# Patient Record
Sex: Female | Born: 1945 | Race: White | Hispanic: No | Marital: Married | Smoking: Former smoker
Health system: Southern US, Community
[De-identification: ages and names within clinical notes are randomized; demographics above are authoritative.]

## PROBLEM LIST (undated history)

## (undated) DIAGNOSIS — N83209 Unspecified ovarian cyst, unspecified side: Secondary | ICD-10-CM

## (undated) DIAGNOSIS — D369 Benign neoplasm, unspecified site: Secondary | ICD-10-CM

## (undated) DIAGNOSIS — N952 Postmenopausal atrophic vaginitis: Secondary | ICD-10-CM

## (undated) DIAGNOSIS — N879 Dysplasia of cervix uteri, unspecified: Secondary | ICD-10-CM

## (undated) HISTORY — PX: APPENDECTOMY: SHX54

## (undated) HISTORY — PX: RHINOPLASTY: SUR1284

## (undated) HISTORY — DX: Postmenopausal atrophic vaginitis: N95.2

## (undated) HISTORY — DX: Benign neoplasm, unspecified site: D36.9

## (undated) HISTORY — DX: Unspecified ovarian cyst, unspecified side: N83.209

## (undated) HISTORY — PX: OTHER SURGICAL HISTORY: SHX169

## (undated) HISTORY — DX: Dysplasia of cervix uteri, unspecified: N87.9

## (undated) HISTORY — PX: TUBAL LIGATION: SHX77

## (undated) HISTORY — PX: COLPOSCOPY: SHX161

## (undated) HISTORY — PX: ANKLE SURGERY: SHX546

---

## 1997-07-16 ENCOUNTER — Other Ambulatory Visit: Admission: RE | Admit: 1997-07-16 | Discharge: 1997-07-16 | Payer: Self-pay | Admitting: Obstetrics and Gynecology

## 1998-12-02 ENCOUNTER — Other Ambulatory Visit: Admission: RE | Admit: 1998-12-02 | Discharge: 1998-12-02 | Payer: Self-pay | Admitting: Obstetrics and Gynecology

## 1999-02-25 ENCOUNTER — Encounter: Admission: RE | Admit: 1999-02-25 | Discharge: 1999-02-25 | Payer: Self-pay | Admitting: Obstetrics and Gynecology

## 1999-02-25 ENCOUNTER — Encounter: Payer: Self-pay | Admitting: Obstetrics and Gynecology

## 2000-01-21 ENCOUNTER — Other Ambulatory Visit: Admission: RE | Admit: 2000-01-21 | Discharge: 2000-01-21 | Payer: Self-pay | Admitting: Obstetrics and Gynecology

## 2000-01-24 ENCOUNTER — Other Ambulatory Visit: Admission: RE | Admit: 2000-01-24 | Discharge: 2000-01-24 | Payer: Self-pay | Admitting: Obstetrics and Gynecology

## 2000-04-24 ENCOUNTER — Encounter: Admission: RE | Admit: 2000-04-24 | Discharge: 2000-04-24 | Payer: Self-pay | Admitting: Obstetrics and Gynecology

## 2000-04-24 ENCOUNTER — Encounter: Payer: Self-pay | Admitting: Obstetrics and Gynecology

## 2000-06-02 ENCOUNTER — Ambulatory Visit (HOSPITAL_COMMUNITY): Admission: RE | Admit: 2000-06-02 | Discharge: 2000-06-02 | Payer: Self-pay | Admitting: Gastroenterology

## 2001-03-12 ENCOUNTER — Other Ambulatory Visit: Admission: RE | Admit: 2001-03-12 | Discharge: 2001-03-12 | Payer: Self-pay | Admitting: Obstetrics and Gynecology

## 2001-06-18 ENCOUNTER — Encounter: Payer: Self-pay | Admitting: Obstetrics and Gynecology

## 2001-06-18 ENCOUNTER — Encounter: Admission: RE | Admit: 2001-06-18 | Discharge: 2001-06-18 | Payer: Self-pay | Admitting: Obstetrics and Gynecology

## 2002-04-29 ENCOUNTER — Other Ambulatory Visit: Admission: RE | Admit: 2002-04-29 | Discharge: 2002-04-29 | Payer: Self-pay | Admitting: Obstetrics and Gynecology

## 2002-07-04 ENCOUNTER — Encounter: Payer: Self-pay | Admitting: Obstetrics and Gynecology

## 2002-07-04 ENCOUNTER — Encounter: Admission: RE | Admit: 2002-07-04 | Discharge: 2002-07-04 | Payer: Self-pay | Admitting: Obstetrics and Gynecology

## 2003-07-02 ENCOUNTER — Other Ambulatory Visit: Admission: RE | Admit: 2003-07-02 | Discharge: 2003-07-02 | Payer: Self-pay | Admitting: Obstetrics and Gynecology

## 2003-08-06 ENCOUNTER — Encounter: Admission: RE | Admit: 2003-08-06 | Discharge: 2003-08-06 | Payer: Self-pay | Admitting: Obstetrics and Gynecology

## 2003-12-24 ENCOUNTER — Ambulatory Visit: Payer: Self-pay | Admitting: Pulmonary Disease

## 2004-01-30 ENCOUNTER — Ambulatory Visit: Payer: Self-pay | Admitting: Pulmonary Disease

## 2004-01-30 LAB — PULMONARY FUNCTION TEST

## 2004-08-11 ENCOUNTER — Other Ambulatory Visit: Admission: RE | Admit: 2004-08-11 | Discharge: 2004-08-11 | Payer: Self-pay | Admitting: Obstetrics and Gynecology

## 2004-09-10 ENCOUNTER — Encounter: Admission: RE | Admit: 2004-09-10 | Discharge: 2004-09-10 | Payer: Self-pay | Admitting: Obstetrics and Gynecology

## 2004-12-31 ENCOUNTER — Ambulatory Visit: Payer: Self-pay | Admitting: Internal Medicine

## 2005-01-20 ENCOUNTER — Encounter: Admission: RE | Admit: 2005-01-20 | Discharge: 2005-01-20 | Payer: Self-pay | Admitting: Family Medicine

## 2005-02-07 ENCOUNTER — Encounter: Admission: RE | Admit: 2005-02-07 | Discharge: 2005-02-07 | Payer: Self-pay | Admitting: Family Medicine

## 2005-02-21 ENCOUNTER — Ambulatory Visit: Payer: Self-pay | Admitting: Pulmonary Disease

## 2005-02-24 ENCOUNTER — Encounter: Admission: RE | Admit: 2005-02-24 | Discharge: 2005-02-24 | Payer: Self-pay | Admitting: Family Medicine

## 2005-03-21 ENCOUNTER — Encounter: Admission: RE | Admit: 2005-03-21 | Discharge: 2005-03-21 | Payer: Self-pay | Admitting: Family Medicine

## 2005-06-01 ENCOUNTER — Ambulatory Visit: Payer: Self-pay | Admitting: Pulmonary Disease

## 2005-09-05 ENCOUNTER — Other Ambulatory Visit: Admission: RE | Admit: 2005-09-05 | Discharge: 2005-09-05 | Payer: Self-pay | Admitting: Obstetrics and Gynecology

## 2005-10-26 ENCOUNTER — Encounter: Admission: RE | Admit: 2005-10-26 | Discharge: 2005-10-26 | Payer: Self-pay | Admitting: Obstetrics and Gynecology

## 2005-11-28 ENCOUNTER — Ambulatory Visit: Payer: Self-pay | Admitting: Emergency Medicine

## 2005-12-02 ENCOUNTER — Ambulatory Visit: Payer: Self-pay | Admitting: Pulmonary Disease

## 2006-01-17 HISTORY — PX: OTHER SURGICAL HISTORY: SHX169

## 2006-06-15 ENCOUNTER — Ambulatory Visit: Payer: Self-pay | Admitting: Pulmonary Disease

## 2006-06-21 ENCOUNTER — Ambulatory Visit: Payer: Self-pay | Admitting: Pulmonary Disease

## 2006-09-13 ENCOUNTER — Ambulatory Visit: Payer: Self-pay | Admitting: Internal Medicine

## 2006-09-13 ENCOUNTER — Ambulatory Visit: Admission: RE | Admit: 2006-09-13 | Discharge: 2006-09-13 | Payer: Self-pay | Admitting: Internal Medicine

## 2006-09-13 LAB — PULMONARY FUNCTION TEST

## 2006-09-20 ENCOUNTER — Other Ambulatory Visit: Admission: RE | Admit: 2006-09-20 | Discharge: 2006-09-20 | Payer: Self-pay | Admitting: Obstetrics and Gynecology

## 2006-11-23 ENCOUNTER — Encounter: Admission: RE | Admit: 2006-11-23 | Discharge: 2006-11-23 | Payer: Self-pay | Admitting: Obstetrics and Gynecology

## 2007-01-02 ENCOUNTER — Ambulatory Visit: Payer: Self-pay | Admitting: Internal Medicine

## 2007-01-02 DIAGNOSIS — J45909 Unspecified asthma, uncomplicated: Secondary | ICD-10-CM | POA: Insufficient documentation

## 2007-01-02 DIAGNOSIS — J310 Chronic rhinitis: Secondary | ICD-10-CM | POA: Insufficient documentation

## 2007-04-10 ENCOUNTER — Ambulatory Visit: Payer: Self-pay | Admitting: Internal Medicine

## 2007-11-12 ENCOUNTER — Other Ambulatory Visit: Admission: RE | Admit: 2007-11-12 | Discharge: 2007-11-12 | Payer: Self-pay | Admitting: Obstetrics and Gynecology

## 2007-11-12 ENCOUNTER — Encounter: Payer: Self-pay | Admitting: Obstetrics and Gynecology

## 2007-11-12 ENCOUNTER — Ambulatory Visit: Payer: Self-pay | Admitting: Obstetrics and Gynecology

## 2008-01-15 ENCOUNTER — Encounter: Admission: RE | Admit: 2008-01-15 | Discharge: 2008-01-15 | Payer: Self-pay | Admitting: Obstetrics and Gynecology

## 2008-12-15 ENCOUNTER — Ambulatory Visit: Payer: Self-pay | Admitting: Obstetrics and Gynecology

## 2008-12-15 ENCOUNTER — Other Ambulatory Visit: Admission: RE | Admit: 2008-12-15 | Discharge: 2008-12-15 | Payer: Self-pay | Admitting: Obstetrics and Gynecology

## 2009-02-09 ENCOUNTER — Encounter: Admission: RE | Admit: 2009-02-09 | Discharge: 2009-02-09 | Payer: Self-pay | Admitting: Obstetrics and Gynecology

## 2009-12-28 ENCOUNTER — Other Ambulatory Visit
Admission: RE | Admit: 2009-12-28 | Discharge: 2009-12-28 | Payer: Self-pay | Source: Home / Self Care | Admitting: Obstetrics and Gynecology

## 2009-12-28 ENCOUNTER — Ambulatory Visit: Payer: Self-pay | Admitting: Obstetrics and Gynecology

## 2010-02-07 ENCOUNTER — Encounter: Payer: Self-pay | Admitting: Obstetrics and Gynecology

## 2010-02-12 ENCOUNTER — Encounter
Admission: RE | Admit: 2010-02-12 | Discharge: 2010-02-12 | Payer: Self-pay | Source: Home / Self Care | Attending: Obstetrics and Gynecology | Admitting: Obstetrics and Gynecology

## 2010-03-11 ENCOUNTER — Institutional Professional Consult (permissible substitution) (INDEPENDENT_AMBULATORY_CARE_PROVIDER_SITE_OTHER): Admitting: Obstetrics and Gynecology

## 2010-03-11 DIAGNOSIS — M810 Age-related osteoporosis without current pathological fracture: Secondary | ICD-10-CM

## 2010-03-25 ENCOUNTER — Ambulatory Visit (INDEPENDENT_AMBULATORY_CARE_PROVIDER_SITE_OTHER): Admitting: Obstetrics and Gynecology

## 2010-03-25 DIAGNOSIS — B3731 Acute candidiasis of vulva and vagina: Secondary | ICD-10-CM

## 2010-03-25 DIAGNOSIS — N898 Other specified noninflammatory disorders of vagina: Secondary | ICD-10-CM

## 2010-03-25 DIAGNOSIS — B373 Candidiasis of vulva and vagina: Secondary | ICD-10-CM

## 2010-06-01 NOTE — Assessment & Plan Note (Signed)
Elizabeth Jenkins                             PULMONARY OFFICE NOTE   JEZEBEL, POLLET                       MRN:          [id number]  DATE:06/15/2006                            DOB:          December 10, 1945    HISTORY OF PRESENT ILLNESS:  The patient is a 65 year old white female  patient of Dr. Jayme Cloud, who has a known history of some mild COPD from  a remote history of smoking, who presents today complaining of a 74-month  history of intermittent chest pain tightness, shortness of breath with  minimal activity, and wheezing that has worsened over the last 2 days.  The patient denies any cough, purulent sputum, fever, chest pain,  exertional chest pain, weight loss, reflux symptoms.  The patient has  not been taking anything over-the-counter for treatment.  Has been using  her albuterol more than usual.   PAST MEDICAL HISTORY:  Reviewed.   CURRENT MEDICATIONS:  Reviewed.   PHYSICAL EXAMINATION:  GENERAL:  The patient is a pleasant female in no  acute distress.  VITAL SIGNS:  She is afebrile with stable vital signs.  Her O2  saturation is 98% on room air.  HEENT:  Nasal mucosa is erythematous.  Nontender sinuses to palpation.  TMs are slightly full without any redness.  NECK:  supple without cervical adenopathy.  there is no JVD.  LUNGS:  Lung sounds reveal coarse breath sounds bilaterally with a few  expiratory wheezes.  CARDIAC:  Regular rate.  ABDOMEN:  Soft and nontender.  EXTREMITIES:  Warm without any calf cyanosis, clubbing or edema.   IMPRESSION AND PLAN:  Mild chronic obstructive pulmonary disease flare.  Chest x-ray is pending at the time of dictation.  The patient will begin  Mucinex DM twice daily.  Use saline nasal spray p.r.n.  Begin a  prednisone taper over the next week.  Xopenex nebulizer treatment was  given in the office.  The patient will return back here in 1-2 months  for follow-up.      Rubye Oaks, NP  Electronically  Signed      Gailen Shelter, MD  Electronically Signed   TP/MedQ  DD: 06/15/2006  DT: 06/15/2006  Job #: [id number]

## 2010-06-01 NOTE — Assessment & Plan Note (Signed)
[facility]                             PULMONARY OFFICE NOTE   PAISELY, BRICK                       MRN:          [id number]  DATE:09/13/2006                            DOB:          Dec 25, 1945    HISTORY:  A 65 year old, white female was previously felt to have COPD  to the point where here labs were sent off for alpha-I antitrypsin  deficiency by her last pulmonary doctor.  It turned out this was  negative.  She comes back as requested for PFTs today and to my  surprise, has perfectly normal lung functions.   She says she has [place] the beach all [place], but she  predict that once the ragweed comes in the fall that she will be stuffy  in the nose and having coughing and dyspnea.  However, she has been on  no medications at all over the summer while at the beach.   She does have minimal residual nasal stuffiness, but no lower  respiratory symptoms at all of any significant cough, dyspnea, fevers,  chills, sweats, chest pain or leg welling.  No nocturnal complaints.   PHYSICAL EXAMINATION:  GENERAL:  She is a slightly anxious, but  pleasant, ambulatory, white female in no acute distress.  VITAL SIGNS:  Stable vital signs.  HEENT:  Mild turbinate edema with watery discharge.  No pallor, polyps  or cyanosis.  Oropharynx is clear.  Dentition negative.  NECK:  Supple without cervical adenopathy or tenderness.  Trachea  midline.  LUNGS:  Lung fields perfectly clear bilaterally to auscultation and  percussion.  HEART:  Regular rate and rhythm without murmurs, rubs or gallops.  ABDOMEN:  Benign.  EXTREMITIES:  Without calf tenderness.  No cyanosis or clubbing.   PFTs were reviewed today and are normal.   IMPRESSION:  No evidence of chronic obstructive pulmonary disease.  This  patient probably has chronic rhinitis with intermittent asthma on the  basis of poorly-controlled rhinitis and/or sinusitis that is seasonal.  One of the  problems is she really does not understand how to use her  medicines and I therefore took the opportunity to educate her on the use  of maintenance versus p.r.n.'s.   RECOMMENDATIONS:  1. Start Nasonex twice daily until all of her nasal symptoms are 100%      resolved.  If she has an acute flareup of nasal symptoms, she needs      to add Afrin to the regimen x5 days and if not satisfied, a sinus      CT scan needs to be done.  2. Albuterol is to be used no more than twice weekly on a p.r.n.      basis.  If she exceeds this, she needs to [place] for an appointment to      re-evaluate whether or not she needs more consistent controlling      medication.  3. I reviewed her PFTs and x-ray from May 2008, as well as her alpha-I      antitrypsin level reassuring her that she does not have significant  chronic lung problems, but is at risk for      acute exacerbations of asthma in the setting of poorly-controlled      rhinitis which needs more attention to detail in terms of managing      both chronically and during excacerbations.     Charlaine Dalton. Sherene Sires, MD, [facility]  Electronically Signed    MBW/MedQ  DD: 09/13/2006  DT: 09/14/2006  Job #: [id number]   cc:   Tasia Catchings, M.D.

## 2010-06-01 NOTE — Assessment & Plan Note (Signed)
[facility]                             PULMONARY OFFICE NOTE   Elizabeth Jenkins, JOHAL                       MRN:          [id number]  DATE:06/21/2006                            DOB:          Apr 30, 1945    This is a very pleasant 65 year old female who follows here for chest  tightness, dyspnea, and left facial pain which she describes as sinus  pain. The patient was last seen by me in November 2007, however she has  seen our nurse practitioner, Rubye Oaks, on the 29th of May for the  same symptoms. At that point, she was placed on a prednisone taper which  have made the symptoms somewhat better, but she still continues to have  difficulties with chest tightness. In the past, she has been tried on  Serevent, Advair, and Foradil with the complaints of muscle cramping  with these medications prompting her to quit them. She is being  maintained on Spiriva which has decreased her air trapping  significantly. She has mostly obstructive disease with very minimal  reactivity, hyperinflation, and a mild decrease in diffusion capacity.  In the past, the patient has been tested for alpha-1 antitrypsin,  however I cannot find the results of this, but to my recollection this  was normal.   The patient, again, had complaints of chest tightness, which she  describes as mostly right sided in location. She denies any  gastroesophageal reflux type of symptoms. She has also had nasal  congestion and runny, watery nose.   The patient denies any fevers, chills, or sweats.   CURRENT MEDICATIONS:  As noted on the intake sheet and include:  1. Premarin.  2. Prometrium.  3. Singulair.  4. Spiriva.  5. She recently discontinued Asmanex.  6. Nasonex as needed, but she has not used it at all and is actually      out of this medication.   PHYSICAL EXAMINATION:  VITAL SIGNS:  As noted, oxygen saturation 99% on  room air.  GENERAL:  This is a thin, well developed female  who is in no acute  distress.  HEENT:  Reveals nasal turbinate edema bilaterally with almost complete  occlusion of the nares.  NECK:  Supple, no adenopathy noted, no JVD.  LUNGS:  Clear, but distant to auscultation bilaterally.  CARDIAC:  Regular rate and rhythm, no murmurs, rubs, or gallops heard.  EXTREMITIES:  The patient has no cyanosis, clubbing, or edema noted.   We did perform spirometry today which compared to prior shows that the  patient has decreased FEV1 to 2.07 liters or 84% predicted with an  FEV1/FVC ratio at 63% predicted, consistent with mild obstruction. We  also reviewed her chest x-ray from the 29th of May and this showed that  the patient had indeed hyperinflation with no acute infiltrate noted. At  this point, I do not deem necessary for the patient to repeat the x-ray.   IMPRESSION:  1. Chronic obstructive pulmonary disease with exacerbation likely      triggered by sinusitis.  2. Acute and chronic sinusitis.  3. Intolerance to multiple  long acting beta agonists.  4. Allergic rhinitis.   PLAN:  1. Place the patient on Rhinocort Aqua 2 inhalations daily to each      nostril.  2. The patient is to continue Spiriva.  3. The patient was instructed to use Mucinex, plain, 2 tablets, twice      daily.  4. For her dyspnea and chest tightness, we will continue using Spiriva      and then use Xopenex HFA 2 inhalations every 4-6 hours p.r.n.  5. The patient will follow up in 6-8 weeks with Dr. Sandrea Hughs, her      choice. She has seen Dr. Sherene Sires before. Follow up at that time will      be needed on alpha-1 antitrypsin study that was ordered today to      double check to make sure that the patient is indeed not deficient      in this regard and also consideration to whether the patient should      be tried on Symbicort at that point. In the past, she has had      difficulties with muscle cramping with long acting beta agonists,      but it may merit giving the  patient a trial on this.  6. The patient is to contact us prior to follow up should any new      problems arise.     Gailen Shelter, MD  Electronically Signed    CLG/MedQ  DD: 06/21/2006  DT: 06/22/2006  Job #: [id number]

## 2010-06-04 NOTE — Assessment & Plan Note (Signed)
[name]                               PULMONARY OFFICE NOTE   Elizabeth Jenkins, Elizabeth Jenkins                       MRN:          [id number]  DATE:11/28/2005                            DOB:          [birth year]    HISTORY OF PRESENT ILLNESS:  The patient is a 65 year old white female  patient of Dr. Jayme Cloud, who presents for an acute office visit.  The  patient has a history of COPD, currently maintained on Spiriva daily.  The  patient complains over the last 5 days that she has had nasal congestion,  sinus pain and pressure, and increased shortness of breath with activity.  The patient has had to use her rescue inhaler on the average 2 times a day  over the last 4 days.  The patient has not been seen in greater than 5  months, reporting that she has been doing very well.  Last visit, she was  started on Asmanex.  However, reports that she did not take and rarely uses  her albuterol up until this past week.   PAST MEDICAL HISTORY:  Reviewed.   CURRENT MEDICATIONS:  Reviewed.   PHYSICAL EXAM:  The patient is a pleasant female, in no acute distress.  She is afebrile with stable vital signs.  HEENT:  Nasal mucosa is erythematous and swollen.  Maxillary sinus  tenderness to percussion.  TMs normal.  NECK:  Supple without adenopathy.  LUNGS:  Fields are clear without any wheezing or crackles.  CARDIAC:  Regular rate and rhythm.  ABDOMEN:  Soft.  UPPER EXTREMITIES:  Warm without any edema.   IMPRESSION AND PLAN:  Acute sinusitis with a mild asthmatic flare.  The  patient is given Augmentin x10 days.  Mucinex DM twice a day.  Nasal hygiene  regimen with saline and Afrin nasal spray x5 days.  The patient was given a  Xopenex nebulizer treatment in the office.  The patient will return here in  4 to 6 weeks with Dr. Jayme Cloud or sooner if needed.      Rubye Oaks, NP  Electronically Signed      Elizabeth Shelter, MD  Electronically Signed   TP/MedQ  DD: 11/28/2005  DT: 11/28/2005  Job #: [id number]

## 2011-01-21 ENCOUNTER — Telehealth: Payer: Self-pay | Admitting: *Deleted

## 2011-01-21 ENCOUNTER — Other Ambulatory Visit: Payer: Self-pay | Admitting: *Deleted

## 2011-01-21 ENCOUNTER — Other Ambulatory Visit: Payer: Self-pay | Admitting: Obstetrics and Gynecology

## 2011-01-21 ENCOUNTER — Ambulatory Visit: Payer: Medicare Other | Admitting: Obstetrics and Gynecology

## 2011-01-21 DIAGNOSIS — N390 Urinary tract infection, site not specified: Secondary | ICD-10-CM | POA: Diagnosis not present

## 2011-01-21 DIAGNOSIS — Z1231 Encounter for screening mammogram for malignant neoplasm of breast: Secondary | ICD-10-CM

## 2011-01-21 LAB — URINALYSIS
Glucose, UA: NEGATIVE mg/dL
Ketones, ur: NEGATIVE mg/dL
Nitrite: NEGATIVE
Protein, ur: NEGATIVE mg/dL
Specific Gravity, Urine: 1.025 (ref 1.005–1.030)
pH: 5 (ref 5.0–8.0)

## 2011-01-21 MED ORDER — NITROFURANTOIN MONOHYD MACRO 100 MG PO CAPS: 100.0000 mg | ORAL_CAPSULE | Freq: Two times a day (BID) | ORAL | Status: AC

## 2011-01-21 NOTE — Telephone Encounter (Signed)
Pt informed with the below note, order in computer. 

## 2011-01-21 NOTE — Telephone Encounter (Signed)
Addended by: Aura Camps on: 01/21/2011 10:08 AM   Modules accepted: Orders

## 2011-01-21 NOTE — Telephone Encounter (Signed)
Pt calling c/o burning with urination off on on all this week. Pt has annual scheduled for feb.18 she is going out of town on Monday. She would like to drop off u/a if possible? No other complains, no fever, nor foul smell or cloudy urine. Please advise

## 2011-01-21 NOTE — Telephone Encounter (Signed)
Have patient drop off urinalysis and we will call her today with results and treatment.

## 2011-01-24 LAB — URINE CULTURE

## 2011-01-25 NOTE — Progress Notes (Signed)
Urine culture showed uti senitive to [employer]

## 2011-02-11 DIAGNOSIS — D369 Benign neoplasm, unspecified site: Secondary | ICD-10-CM | POA: Insufficient documentation

## 2011-02-11 DIAGNOSIS — N879 Dysplasia of cervix uteri, unspecified: Secondary | ICD-10-CM | POA: Insufficient documentation

## 2011-02-11 DIAGNOSIS — N952 Postmenopausal atrophic vaginitis: Secondary | ICD-10-CM | POA: Insufficient documentation

## 2011-02-16 ENCOUNTER — Other Ambulatory Visit: Payer: Self-pay | Admitting: Dermatology

## 2011-02-16 DIAGNOSIS — D485 Neoplasm of uncertain behavior of skin: Secondary | ICD-10-CM | POA: Diagnosis not present

## 2011-02-16 DIAGNOSIS — L723 Sebaceous cyst: Secondary | ICD-10-CM | POA: Diagnosis not present

## 2011-02-16 DIAGNOSIS — D2330 Other benign neoplasm of skin of unspecified part of face: Secondary | ICD-10-CM | POA: Diagnosis not present

## 2011-02-16 DIAGNOSIS — L259 Unspecified contact dermatitis, unspecified cause: Secondary | ICD-10-CM | POA: Diagnosis not present

## 2011-02-16 DIAGNOSIS — D239 Other benign neoplasm of skin, unspecified: Secondary | ICD-10-CM | POA: Diagnosis not present

## 2011-02-17 ENCOUNTER — Ambulatory Visit
Admission: RE | Admit: 2011-02-17 | Discharge: 2011-02-17 | Disposition: A | Discharge status: Home / Self Care | Source: Ambulatory Visit | Attending: Obstetrics and Gynecology | Admitting: Obstetrics and Gynecology

## 2011-02-17 DIAGNOSIS — Z1231 Encounter for screening mammogram for malignant neoplasm of breast: Secondary | ICD-10-CM

## 2011-02-18 DIAGNOSIS — Z23 Encounter for immunization: Secondary | ICD-10-CM | POA: Diagnosis not present

## 2011-03-07 ENCOUNTER — Encounter: Admitting: Obstetrics and Gynecology

## 2011-03-24 ENCOUNTER — Encounter: Payer: Self-pay | Admitting: Obstetrics and Gynecology

## 2011-03-24 ENCOUNTER — Ambulatory Visit (INDEPENDENT_AMBULATORY_CARE_PROVIDER_SITE_OTHER): Payer: Medicare Other | Admitting: Obstetrics and Gynecology

## 2011-03-24 ENCOUNTER — Other Ambulatory Visit (HOSPITAL_COMMUNITY)
Admission: RE | Admit: 2011-03-24 | Discharge: 2011-03-24 | Disposition: A | Discharge status: Home / Self Care | Payer: Medicare Other | Source: Ambulatory Visit | Attending: Obstetrics and Gynecology | Admitting: Obstetrics and Gynecology

## 2011-03-24 VITALS — BP 120/74 | Ht 65.0 in | Wt 122.0 lb

## 2011-03-24 DIAGNOSIS — Z780 Asymptomatic menopausal state: Secondary | ICD-10-CM

## 2011-03-24 DIAGNOSIS — N951 Menopausal and female climacteric states: Secondary | ICD-10-CM | POA: Diagnosis not present

## 2011-03-24 DIAGNOSIS — N83209 Unspecified ovarian cyst, unspecified side: Secondary | ICD-10-CM | POA: Insufficient documentation

## 2011-03-24 DIAGNOSIS — M810 Age-related osteoporosis without current pathological fracture: Secondary | ICD-10-CM

## 2011-03-24 DIAGNOSIS — N952 Postmenopausal atrophic vaginitis: Secondary | ICD-10-CM

## 2011-03-24 DIAGNOSIS — Z124 Encounter for screening for malignant neoplasm of cervix: Secondary | ICD-10-CM | POA: Insufficient documentation

## 2011-03-24 NOTE — Progress Notes (Signed)
Patient came back to see me today for further followup. The first and we discussed is her osteoporosis. She had previously taken biphosphonate's for slightly under 5 years. She still takes calcium and vitamin D. She's had no fractures. She is a nonsmoker. She drinks alcohol in moderation. Her last bone density was in January of 2012. Her worst T score was -2.8 at the left femoral neck. Her bone density and remained stable everywhere but in her wrist. She had lost 5.8% bone in her wrist. She does have vaginal dryness but is fine with intercourse without estrogen cream. She was on menopausal hormonal replacement for many years. She would take breaks. She sometimes had to reinitiate it. She now seems to be fine without it. She does her lab her PCP. She is having no vaginal bleeding. She is having no pelvic pain.  ROS: 12 system review done. Pertinent positives above. Other positives are asthma and chronic rhinitis. She recently had ato take prednisone for an upper respiratory infection with a great  Result.  Physical examination: Kennon Portela present. HEENT within normal limits. Neck: Thyroid not large. No masses. Supraclavicular nodes: not enlarged. Breasts: Examined in both sitting midline position. No skin changes and no masses. Abdomen: Soft no guarding rebound or masses or hernia. Pelvic: External: Within normal limits. BUS: Within normal limits. Vaginal:within normal limits. Fair  estrogen effect. No evidence of cystocele rectocele or enterocele. Cervix: clean. Uterus: Normal size and shape. Adnexa: No masses. Rectovaginal exam: Confirmatory and negative. Extremities: Within normal limits.  Assessment: #1. Osteoporosis #2. Atrophic vaginitis #3. Menopausal symptoms now resolved  Plan: Discussed pros and cons of reinitiating drug treatment for her osteoporosis. Discussed a typical fractures and ON J. For the moment patient declined therapy. She will continue yearly mammograms. We will do followup   bone density in 2014.

## 2011-03-29 DIAGNOSIS — J449 Chronic obstructive pulmonary disease, unspecified: Secondary | ICD-10-CM | POA: Diagnosis not present

## 2011-03-29 DIAGNOSIS — J45909 Unspecified asthma, uncomplicated: Secondary | ICD-10-CM | POA: Diagnosis not present

## 2011-03-29 DIAGNOSIS — J301 Allergic rhinitis due to pollen: Secondary | ICD-10-CM | POA: Diagnosis not present

## 2011-03-29 DIAGNOSIS — J019 Acute sinusitis, unspecified: Secondary | ICD-10-CM | POA: Diagnosis not present

## 2011-07-13 DIAGNOSIS — Z23 Encounter for immunization: Secondary | ICD-10-CM | POA: Diagnosis not present

## 2011-10-11 DIAGNOSIS — L723 Sebaceous cyst: Secondary | ICD-10-CM | POA: Diagnosis not present

## 2011-10-11 DIAGNOSIS — L821 Other seborrheic keratosis: Secondary | ICD-10-CM | POA: Diagnosis not present

## 2011-10-14 DIAGNOSIS — Z23 Encounter for immunization: Secondary | ICD-10-CM | POA: Diagnosis not present

## 2012-01-13 ENCOUNTER — Other Ambulatory Visit: Payer: Self-pay | Admitting: *Deleted

## 2012-01-13 MED ORDER — ZOLPIDEM TARTRATE 10 MG PO TABS: 10.0000 mg | ORAL_TABLET | Freq: Every evening | ORAL | Status: DC | PRN

## 2012-01-13 NOTE — Telephone Encounter (Signed)
rx called in [facility] 

## 2012-01-30 ENCOUNTER — Other Ambulatory Visit: Payer: Self-pay | Admitting: Internal Medicine

## 2012-01-30 DIAGNOSIS — Z1231 Encounter for screening mammogram for malignant neoplasm of breast: Secondary | ICD-10-CM

## 2012-01-30 DIAGNOSIS — E559 Vitamin D deficiency, unspecified: Secondary | ICD-10-CM | POA: Diagnosis not present

## 2012-01-30 DIAGNOSIS — R030 Elevated blood-pressure reading, without diagnosis of hypertension: Secondary | ICD-10-CM | POA: Diagnosis not present

## 2012-02-02 DIAGNOSIS — Z23 Encounter for immunization: Secondary | ICD-10-CM | POA: Diagnosis not present

## 2012-02-02 DIAGNOSIS — Z0000 Encounter for general adult medical examination without abnormal findings: Secondary | ICD-10-CM | POA: Diagnosis not present

## 2012-02-02 DIAGNOSIS — Z1331 Encounter for screening for depression: Secondary | ICD-10-CM | POA: Diagnosis not present

## 2012-02-02 DIAGNOSIS — I452 Bifascicular block: Secondary | ICD-10-CM | POA: Diagnosis not present

## 2012-02-02 DIAGNOSIS — J449 Chronic obstructive pulmonary disease, unspecified: Secondary | ICD-10-CM | POA: Diagnosis not present

## 2012-02-06 DIAGNOSIS — Z1212 Encounter for screening for malignant neoplasm of rectum: Secondary | ICD-10-CM | POA: Diagnosis not present

## 2012-02-23 ENCOUNTER — Ambulatory Visit
Admission: RE | Admit: 2012-02-23 | Discharge: 2012-02-23 | Disposition: A | Discharge status: Home / Self Care | Payer: Medicare Other | Source: Ambulatory Visit | Attending: Internal Medicine | Admitting: Internal Medicine

## 2012-02-23 DIAGNOSIS — Z1231 Encounter for screening mammogram for malignant neoplasm of breast: Secondary | ICD-10-CM

## 2012-05-07 DIAGNOSIS — Z124 Encounter for screening for malignant neoplasm of cervix: Secondary | ICD-10-CM | POA: Diagnosis not present

## 2012-10-01 DIAGNOSIS — H1044 Vernal conjunctivitis: Secondary | ICD-10-CM | POA: Diagnosis not present

## 2012-10-16 DIAGNOSIS — Z23 Encounter for immunization: Secondary | ICD-10-CM | POA: Diagnosis not present

## 2012-10-30 DIAGNOSIS — L909 Atrophic disorder of skin, unspecified: Secondary | ICD-10-CM | POA: Diagnosis not present

## 2012-10-30 DIAGNOSIS — D1801 Hemangioma of skin and subcutaneous tissue: Secondary | ICD-10-CM | POA: Diagnosis not present

## 2012-10-30 DIAGNOSIS — L821 Other seborrheic keratosis: Secondary | ICD-10-CM | POA: Diagnosis not present

## 2012-12-19 DIAGNOSIS — J449 Chronic obstructive pulmonary disease, unspecified: Secondary | ICD-10-CM | POA: Diagnosis not present

## 2012-12-19 DIAGNOSIS — IMO0002 Reserved for concepts with insufficient information to code with codable children: Secondary | ICD-10-CM | POA: Diagnosis not present

## 2012-12-19 DIAGNOSIS — J45909 Unspecified asthma, uncomplicated: Secondary | ICD-10-CM | POA: Diagnosis not present

## 2012-12-19 DIAGNOSIS — J301 Allergic rhinitis due to pollen: Secondary | ICD-10-CM | POA: Diagnosis not present

## 2012-12-19 DIAGNOSIS — J209 Acute bronchitis, unspecified: Secondary | ICD-10-CM | POA: Diagnosis not present

## 2013-01-31 DIAGNOSIS — E559 Vitamin D deficiency, unspecified: Secondary | ICD-10-CM | POA: Diagnosis not present

## 2013-01-31 DIAGNOSIS — E785 Hyperlipidemia, unspecified: Secondary | ICD-10-CM | POA: Diagnosis not present

## 2013-02-07 DIAGNOSIS — E559 Vitamin D deficiency, unspecified: Secondary | ICD-10-CM | POA: Diagnosis not present

## 2013-02-07 DIAGNOSIS — M810 Age-related osteoporosis without current pathological fracture: Secondary | ICD-10-CM | POA: Diagnosis not present

## 2013-02-07 DIAGNOSIS — J301 Allergic rhinitis due to pollen: Secondary | ICD-10-CM | POA: Diagnosis not present

## 2013-02-07 DIAGNOSIS — Z1331 Encounter for screening for depression: Secondary | ICD-10-CM | POA: Diagnosis not present

## 2013-02-07 DIAGNOSIS — Z0000 Encounter for general adult medical examination without abnormal findings: Secondary | ICD-10-CM | POA: Diagnosis not present

## 2013-02-07 DIAGNOSIS — E785 Hyperlipidemia, unspecified: Secondary | ICD-10-CM | POA: Diagnosis not present

## 2013-02-07 DIAGNOSIS — J45909 Unspecified asthma, uncomplicated: Secondary | ICD-10-CM | POA: Diagnosis not present

## 2013-02-07 DIAGNOSIS — Z8249 Family history of ischemic heart disease and other diseases of the circulatory system: Secondary | ICD-10-CM | POA: Diagnosis not present

## 2013-02-08 ENCOUNTER — Other Ambulatory Visit: Payer: Self-pay

## 2013-02-08 DIAGNOSIS — Z1231 Encounter for screening mammogram for malignant neoplasm of breast: Secondary | ICD-10-CM

## 2013-02-12 DIAGNOSIS — Z1212 Encounter for screening for malignant neoplasm of rectum: Secondary | ICD-10-CM | POA: Diagnosis not present

## 2013-02-15 ENCOUNTER — Other Ambulatory Visit: Payer: Self-pay | Admitting: Internal Medicine

## 2013-02-15 DIAGNOSIS — M810 Age-related osteoporosis without current pathological fracture: Secondary | ICD-10-CM

## 2013-02-18 ENCOUNTER — Institutional Professional Consult (permissible substitution): Payer: Medicare Other | Admitting: Pulmonary Disease

## 2013-03-01 ENCOUNTER — Ambulatory Visit
Admission: RE | Admit: 2013-03-01 | Discharge: 2013-03-01 | Disposition: A | Discharge status: Home / Self Care | Payer: Medicare Other | Source: Ambulatory Visit | Attending: Internal Medicine | Admitting: Internal Medicine

## 2013-03-01 ENCOUNTER — Ambulatory Visit
Admission: RE | Admit: 2013-03-01 | Discharge: 2013-03-01 | Disposition: A | Discharge status: Home / Self Care | Payer: Medicare Other | Source: Ambulatory Visit

## 2013-03-01 DIAGNOSIS — Z1231 Encounter for screening mammogram for malignant neoplasm of breast: Secondary | ICD-10-CM

## 2013-03-01 DIAGNOSIS — M810 Age-related osteoporosis without current pathological fracture: Secondary | ICD-10-CM

## 2013-03-28 ENCOUNTER — Encounter: Payer: Self-pay | Admitting: Pulmonary Disease

## 2013-03-28 ENCOUNTER — Ambulatory Visit (INDEPENDENT_AMBULATORY_CARE_PROVIDER_SITE_OTHER): Payer: Medicare Other | Admitting: Pulmonary Disease

## 2013-03-28 VITALS — BP 122/82 | HR 68 | Temp 97.4°F | Ht 64.0 in | Wt 124.6 lb

## 2013-03-28 DIAGNOSIS — J479 Bronchiectasis, uncomplicated: Secondary | ICD-10-CM | POA: Diagnosis not present

## 2013-03-28 DIAGNOSIS — J45909 Unspecified asthma, uncomplicated: Secondary | ICD-10-CM | POA: Diagnosis not present

## 2013-03-28 NOTE — Assessment & Plan Note (Signed)
The patient has a history of asthma, which has been documented with both normal an abnormal PFTs. She is currently having some increased chest tightness and other symptoms that are consistent with air trapping, and improves with albuterol. However, she is not using her inhaled corticosteroids on a consistent basis, and I have had a long discussion with her about the pathophysiology of asthma. I have explained that if we do not control her airway inflammation, consistent basis, this can lead to fixed airflow obstruction/COPD.  I would like for her to get back on inhaled corticosteroids on a regular basis, and see how her symptoms respond.  Will also do pulmonary function studies at 4 weeks to get some idea of her new baseline. If she continues to have symptoms despite Asmanex on a regular basis, will consider adding a long-acting beta agonist to her regimen.

## 2013-03-28 NOTE — Progress Notes (Signed)
   Subjective:    Patient ID: Elizabeth Jenkins, female    DOB: 01-Jul-1945, 68 y.o.   MRN: [id number]  HPI The patient is a 68 year old female who I've been asked to see for asthma and dyspnea. She was followed in this office back in 2008, and was felt to have asthma. She had spirometry that was clearly abnormal, and a followup on medication that showed normal flows. She also had an alpha-1 antitrypsin level done that was normal. Currently, the patient is on Asmanex only as needed, as well as Singulair. She rarely uses these medications, as well as her around purulent rescue inhaler. Most recently, she has been developing chest tightness, with other symptoms consistent with air trapping. This does improve with as needed albuterol. She feels that she is able to do everything that she wishes to do, but her breathing can sometimes interfere with her physical activity to some degree. She denies any cough or mucus production currently, but at times this can be an issue. She has had a recent cardiac CT that included limited imaging of the chest. The report mentions mild bronchiectasis with atelectasis. The patient denies any chronic purulent mucus production.   Review of Systems  Constitutional: Negative for fever and unexpected weight change.  HENT: Positive for congestion. Negative for dental problem, ear pain, nosebleeds, postnasal drip, rhinorrhea, sinus pressure, sneezing, sore throat and trouble swallowing.   Eyes: Negative for redness and itching.  Respiratory: Positive for chest tightness and shortness of breath. Negative for cough and wheezing.   Cardiovascular: Negative for palpitations and leg swelling.  Gastrointestinal: Negative for nausea and vomiting.  Genitourinary: Negative for dysuria.  Musculoskeletal: Negative for joint swelling.  Skin: Negative for rash.  Neurological: Negative for headaches.  Hematological: Does not bruise/bleed easily.  Psychiatric/Behavioral: Negative for dysphoric  mood. The patient is not nervous/anxious.        Objective:   Physical Exam Constitutional:  Thin female, no acute distress  HENT:  Nares patent without discharge, but very narrowed bilat  Oropharynx without exudate, palate and uvula are normal  Eyes:  Perrla, eomi, no scleral icterus  Neck:  No JVD, no TMG  Cardiovascular:  Normal rate, regular rhythm, no rubs or gallops.  No murmurs        Intact distal pulses  Pulmonary :  Normal breath sounds, no stridor or respiratory distress   No rales, rhonchi, or wheezing  Abdominal:  Soft, nondistended, bowel sounds present.  No tenderness noted.   Musculoskeletal:  No lower extremity edema noted.  Lymph Nodes:  No cervical lymphadenopathy noted  Skin:  No cyanosis noted  Neurologic:  Alert, appropriate, moves all 4 extremities without obvious deficit.         Assessment & Plan:

## 2013-03-28 NOTE — Assessment & Plan Note (Signed)
The patient was incidentally noted to have mild bronchiectasis with atelectasis on a recent cardiac screen CT scan.  Those images are not available for my review. She will need to have a regular CT chest in order to better evaluate this.

## 2013-03-28 NOTE — Patient Instructions (Signed)
Will schedule for ct scan of your chest to evaluate your recent finding of bronchiectasis on your limited cardiac scan.  Will call you with results. Get back on asmanex 2 inhalations each night until your followup visit. Can use nasonex 2 sprays each nostril every am on a regular basis to see if this will help with your nasal congestion. Will schedule for breathing studies in 4 weeks, and see you back the same day to review with you.

## 2013-03-29 NOTE — Addendum Note (Signed)
Addended by: Virl Cagey on: 03/29/2013 11:06 AM   Modules accepted: Orders

## 2013-04-01 ENCOUNTER — Encounter: Payer: Self-pay | Admitting: Cardiology

## 2013-04-01 ENCOUNTER — Ambulatory Visit (INDEPENDENT_AMBULATORY_CARE_PROVIDER_SITE_OTHER): Payer: Medicare Other | Admitting: Cardiology

## 2013-04-01 VITALS — BP 160/84 | HR 74 | Ht 64.0 in | Wt 123.0 lb

## 2013-04-01 DIAGNOSIS — R0789 Other chest pain: Secondary | ICD-10-CM | POA: Diagnosis not present

## 2013-04-01 DIAGNOSIS — R931 Abnormal findings on diagnostic imaging of heart and coronary circulation: Secondary | ICD-10-CM

## 2013-04-01 DIAGNOSIS — E785 Hyperlipidemia, unspecified: Secondary | ICD-10-CM | POA: Diagnosis not present

## 2013-04-01 DIAGNOSIS — R9389 Abnormal findings on diagnostic imaging of other specified body structures: Secondary | ICD-10-CM

## 2013-04-01 DIAGNOSIS — Z0389 Encounter for observation for other suspected diseases and conditions ruled out: Secondary | ICD-10-CM

## 2013-04-01 DIAGNOSIS — R9431 Abnormal electrocardiogram [ECG] [EKG]: Secondary | ICD-10-CM

## 2013-04-01 MED ORDER — ATORVASTATIN CALCIUM 10 MG PO TABS: 10.0000 mg | ORAL_TABLET | Freq: Every day | ORAL | Status: DC

## 2013-04-01 NOTE — Patient Instructions (Signed)
**Note De-Identified Machael Raine Obfuscation** Your physician has recommended you make the following change in your medication: start taking Atorvastatin 10 mg at bedtime  Your physician wants you to follow-up in: 1 year. You will receive a reminder letter in the mail two months in advance. If you don't receive a letter, please call our office to schedule the follow-up appointment.

## 2013-04-01 NOTE — Assessment & Plan Note (Signed)
Currently she has left axis deviation. She has a piece of paper stating that she may have had left bundle branch block at some time in the past. I do not have documentation of this. No further workup is needed.

## 2013-04-01 NOTE — Progress Notes (Signed)
HPI   The patient is here today as a new patient to establish cardiology care. Her father passed away from a presumed heart attack at age 68. Recently she had a calcium score done and the result was 43. She has come in for further cardiac evaluation. The patient does not have hypertension or diabetes. She is very active and does not have exertional chest pain. She has never smoked. Her most recent lipids revealed a total cholesterol of 254, HDL 73, triglycerides 71, LDL 160.  There was question of some bronchiectasis on her calcium score CT. She is having further evaluation of this by the pulmonary team. She has a chronic discomfort under her left breast. It is not related to exertion. It is not clearly related to inspiration either.  Allergies  Allergen Reactions  . Quinine Derivatives     Current Outpatient Prescriptions  Medication Sig Dispense Refill  . albuterol (PROVENTIL HFA;VENTOLIN HFA) 108 (90 BASE) MCG/ACT inhaler Inhale 2 puffs into the lungs every 6 (six) hours as needed for wheezing or shortness of breath.      . mometasone (ASMANEX) 220 MCG/INH inhaler Inhale 2 puffs into the lungs daily as needed.      . mometasone (NASONEX) 50 MCG/ACT nasal spray Place 2 sprays into the nose daily.      . montelukast (SINGULAIR) 10 MG tablet Take 10 mg by mouth at bedtime.      Marland Kitchen zolpidem (AMBIEN) 10 MG tablet Take 1 tablet (10 mg total) by mouth at bedtime as needed.  30 tablet  4  . atorvastatin (LIPITOR) 10 MG tablet Take 1 tablet (10 mg total) by mouth daily.  30 tablet  11   No current facility-administered medications for this visit.    History   Social History  . Marital Status: Married    Spouse Name: N/A    Number of Children: N/A  . Years of Education: N/A   Occupational History  . Retired    Social History Main Topics  . Smoking status: Former Smoker -- 0.50 packs/day for 5 years    Types: Cigarettes    Quit date: 01/17/1970  . Smokeless tobacco: Not on file  .  Alcohol Use: 2.5 oz/week    5 drink(s) per week     Comment: 5 nights/week--glass of wine  . Drug Use: No  . Sexual Activity: Yes    Birth Control/ Protection: Post-menopausal, Surgical   Other Topics Concern  . Not on file   Social History Narrative  . No narrative on file    Family History  Problem Relation Age of Onset  . Diabetes Mother   . Heart disease Mother   . Hypertension Mother   . Diabetes Father   . Heart disease Father   . Hypertension Sister     Past Medical History  Diagnosis Date  . Cervical intraepithelial neoplasia (CIN)   . Dermoid   . Osteoporosis   . Atrophic vaginitis   . Asthma   . Ovarian cyst     Past Surgical History  Procedure Laterality Date  . Cone biopsy of cervix    . Tubal ligation    . Excision of benign labial lesion  2008  . Ankle surgery    . Rhinoplasty    . Colposcopy    . Appendectomy      Patient Active Problem List   Diagnosis Date Noted  . Chest discomfort 04/01/2013  . Agatston coronary artery calcium score less than 100  04/01/2013  . Dyslipidemia 04/01/2013  . Bronchiectasis without acute exacerbation 03/28/2013  . Ovarian cyst   . Cervical intraepithelial neoplasia (CIN)   . Dermoid   . Osteoporosis   . Atrophic vaginitis   . Asthma   . CHRONIC RHINITIS 01/02/2007  . Intrinsic asthma 01/02/2007    ROS   Patient denies fever, chills, headache, sweats, rash, change in vision, change in hearing, cough, nausea vomiting, urinary symptoms. All other systems are reviewed and are negative.  PHYSICAL EXAM  Patient is stable. She is oriented to person time and place. Affect is normal. There is no jugulovenous distention. Lungs reveal a few scattered rhonchi. Cardiac exam reveals S1 and S2. There are no murmurs. The abdomen is soft. There is no peripheral edema. There no musculoskeletal deformities. There are no skin rashes.  Filed Vitals:   04/01/13 1430  BP: 160/84  Pulse: 74  Height: 5\' 4"  (1.626 m)  Weight:  123 lb (55.792 kg)   EKG is done today and reviewed by me. There is left axis deviation.  ASSESSMENT & PLAN

## 2013-04-01 NOTE — Assessment & Plan Note (Signed)
I have used the following values to calculate her 10 year risk score in the new risk calculator. Total cholesterol 254, HDL 73, age 67,. I chose a blood pressure of 120 as this is her usual blood pressure. Her pressure is higher today because she is definitely anxious. Her calculated risk for is 6.2%.

## 2013-04-01 NOTE — Assessment & Plan Note (Signed)
I explained to the patient that a calcium score of 43 means that she does have atherosclerotic disease of her coronary arteries. I explained that it does not mean that she has any significant stenoses. Clinically she has no symptoms and she is quite active. We talked at length about whether or not stress testing is needed. At this time we have decided that she does not need a stress test. The patient's 10 year calculated risk for his 6.2%. The recommendation is careful consideration of moderate dosed statins. The decision is affected by the fact that her LDL on the most recent evaluation was above 160. Also she has a definite family history with her father dying of an MI at age 16. Based on this data I have recommended that we start Lipitor 20 mg daily. We had a careful discussion. She was concerned because she thinks that her husband's mental status was affected by a statin. They stopped statin and he improved rapidly. However she is willing to follow my recommendation to give Lipitor a trial. We will started only 10 mg daily. I will be in touch with her and we will decide if she is tolerating the medicine and if the dose can be pushed to 20 mg. For now no other workup will be done.

## 2013-04-01 NOTE — Assessment & Plan Note (Signed)
I feel that the patient's chest discomfort is not cardiac in origin. It may be musculoskeletal or possibly related to her pulmonary disease.

## 2013-04-02 ENCOUNTER — Encounter: Payer: Self-pay | Admitting: Pulmonary Disease

## 2013-04-04 ENCOUNTER — Ambulatory Visit (INDEPENDENT_AMBULATORY_CARE_PROVIDER_SITE_OTHER)
Admission: RE | Admit: 2013-04-04 | Discharge: 2013-04-04 | Disposition: A | Discharge status: Home / Self Care | Payer: Medicare Other | Source: Ambulatory Visit | Attending: Pulmonary Disease | Admitting: Pulmonary Disease

## 2013-04-04 DIAGNOSIS — J984 Other disorders of lung: Secondary | ICD-10-CM | POA: Diagnosis not present

## 2013-04-04 DIAGNOSIS — J479 Bronchiectasis, uncomplicated: Secondary | ICD-10-CM

## 2013-04-05 ENCOUNTER — Telehealth: Payer: Self-pay | Admitting: Pulmonary Disease

## 2013-04-05 NOTE — Telephone Encounter (Signed)
Pt is calling again.

## 2013-04-05 NOTE — Telephone Encounter (Signed)
Notes Recorded by Kathee Delton, MD on 04/05/2013 at 9:00 AM Please let pt know that her ct chest does show some mild bronchiectasis, but everything else looks good. Will discuss with her further at upcoming  ---  I spoke with patient about results and she verbalized understanding and had no questions

## 2013-04-17 ENCOUNTER — Telehealth: Payer: Self-pay | Admitting: Cardiology

## 2013-04-17 NOTE — Telephone Encounter (Signed)
Left message for pt of Dr Ron Parker instructions and requested she call back if any questions or concerns.

## 2013-04-17 NOTE — Telephone Encounter (Signed)
Please call and tell her that I do not want her to take the Lipitor. We will wait a few weeks, and I will then call her to discuss next possible steps.

## 2013-04-17 NOTE — Telephone Encounter (Signed)
Patient has taken lipitor 10 mg Q HS for about 10 days and became very tired on it, requiring daytime naps.   Stopped taking it last Friday and feels much better. Is this a side effect that will get better over time? if so she will restart it, otherwise--   Is there another medication that she could try? She did feel mild body aches as well, which are tolerable, however. Will route to Dr. Ron Parker for recommendation.

## 2013-04-17 NOTE — Telephone Encounter (Signed)
New message     Cholesterol medication is making her very very tired.  Is there anything else she can take?

## 2013-05-01 ENCOUNTER — Telehealth: Payer: Self-pay | Admitting: Cardiology

## 2013-05-01 NOTE — Telephone Encounter (Signed)
See phone note from 4/1 The pt called back today to let us know that she stopped taking Lipitor on 3/27 and all of her s/s have gone away. She denies fatigue, weakness and body aches. She wants to know if there is another medication that Dr Ron Parker wants her to take instead. Please advise.

## 2013-05-01 NOTE — Telephone Encounter (Signed)
New message     Saw Dr Ron Parker approx 35mo ago.  He put her on a new medication----she had problems on the medication and was told to stop medicine for two weeks and we would call her to follow up.  No one called.  She need to give an update to [place].

## 2013-05-02 ENCOUNTER — Ambulatory Visit (INDEPENDENT_AMBULATORY_CARE_PROVIDER_SITE_OTHER): Payer: Medicare Other | Admitting: Pulmonary Disease

## 2013-05-02 ENCOUNTER — Encounter: Payer: Self-pay | Admitting: Pulmonary Disease

## 2013-05-02 VITALS — BP 114/76 | HR 72 | Temp 97.5°F | Ht 64.0 in | Wt 123.0 lb

## 2013-05-02 DIAGNOSIS — J479 Bronchiectasis, uncomplicated: Secondary | ICD-10-CM

## 2013-05-02 DIAGNOSIS — J45909 Unspecified asthma, uncomplicated: Secondary | ICD-10-CM

## 2013-05-02 NOTE — Patient Instructions (Signed)
Stay on asmanex 2 puffs each night on a regular basis.  Keep mouth rinsed If your chest tightness doesn't resolve, can consider adding another type of medicine to the asmanex to see if helps.  Let us know. followup with me again in 26mos, but please call if having ongoing breathing issues.

## 2013-05-02 NOTE — [facility] Notes (Signed)
   Subjective:    Patient ID: Elizabeth Jenkins, female    DOB: 07-13-1945, 68 y.o.   MRN: [id number]  HPI The patient comes in today for followup of her known asthma. She is also had a CT of her chest recently that shows mild bronchiectasis. She was restarted back on an inhaled corticosteroid at the last visit because dyspnea and chest tightness, and she clearly feels that she is better. However, she still feels a slight tightness in her left chest that is persistent. She has had pulmonary function studies today that are essentially normal.   Review of Systems  Constitutional: Negative for fever and unexpected weight change.  HENT: Negative for congestion, dental problem, ear pain, nosebleeds, postnasal drip, rhinorrhea, sinus pressure, sneezing, sore throat and trouble swallowing.   Eyes: Negative for redness and itching.  Respiratory: Positive for shortness of breath. Negative for cough, chest tightness and wheezing.   Cardiovascular: Negative for palpitations and leg swelling.  Gastrointestinal: Negative for nausea and vomiting.  Genitourinary: Negative for dysuria.  Musculoskeletal: Negative for joint swelling.  Skin: Negative for rash.  Neurological: Negative for headaches.  Hematological: Does not bruise/bleed easily.  Psychiatric/Behavioral: Negative for dysphoric mood. The patient is not nervous/anxious.        Objective:   Physical Exam Thin female in no acute distress Nose without purulence or discharge noted Neck without lymphadenopathy or thyromegaly Chest totally clear to auscultation, no wheezing Cardiac exam with regular rate and rhythm Lower extremities without edema, no cyanosis       Assessment & Plan:

## 2013-05-02 NOTE — [facility] Notes (Signed)
PFT done today. 

## 2013-05-02 NOTE — Assessment & Plan Note (Signed)
The patient clearly has asthma, and has seen improvement in her symptoms since being back on inhaled corticosteroids on a regular basis. She still occasionally feels a tightness in her chest, and it is unclear whether this is even related to a pulmonary issue. She has not taken her Asmanex as I had prescribed, and I have asked her to try and do this over the next month. If she continues to have issues, we can always add a LABA to her regimen to see if that will help the chest tightness. Again, I stressed to her the importance of compliance with anti-inflammatory therapy.

## 2013-05-02 NOTE — Assessment & Plan Note (Signed)
The patient does have mild bronchiectasis on her high-resolution CT, but she does not have persistent cough, congestion, mucus, or frequent pulmonary infections. Hopefully, this will be a non-issue for her.

## 2013-05-06 MED ORDER — PRAVASTATIN SODIUM 10 MG PO TABS: 10.0000 mg | ORAL_TABLET | Freq: Every evening | ORAL | Status: DC

## 2013-05-06 NOTE — Telephone Encounter (Signed)
**Note De-Identified Stephene Alegria Obfuscation** The pt is advised, she verbalized understanding and agrees to try Pravastatin. RX sent to [facility] to fill per the pts request.

## 2013-05-06 NOTE — Telephone Encounter (Signed)
Please let the patient know that I would like for her to try  pravastatin 10 mg daily. Please make sure she knows that this is a less potent statin than Lipitor. If she does not tolerate this, we will not try any others. However, I do feel that we should try this medication.

## 2013-05-07 ENCOUNTER — Encounter: Payer: Self-pay | Admitting: Pulmonary Disease

## 2013-05-07 DIAGNOSIS — L259 Unspecified contact dermatitis, unspecified cause: Secondary | ICD-10-CM | POA: Diagnosis not present

## 2013-05-07 DIAGNOSIS — L723 Sebaceous cyst: Secondary | ICD-10-CM | POA: Diagnosis not present

## 2013-05-08 ENCOUNTER — Encounter: Payer: Self-pay | Admitting: Pulmonary Disease

## 2013-05-08 NOTE — Telephone Encounter (Signed)
Called spoke with patient.  Discussed KC's recs about trying the Asmanex in the morning and that it does not contain a medication that causes cramps.  Pt verbalized her understanding and will try taking her Asmanex 2 puffs in the AM for 1 week.  She has about 2 weeks left of this current inhaler, and will call before it runs out to either request a refill or medication change.  Nothing further needed at this time; will sign off.

## 2013-05-08 NOTE — Telephone Encounter (Signed)
Let pt know that advair has med in it which can possibly cause cramps, but the asmanex does not.  Would be very unusual for asmanex to cause this.  We can either change to something else, or you can take the asmanex in the am for a few weeks to see if that makes a difference.  The med does better at night, but can work effectively during the day.

## 2013-05-08 NOTE — Telephone Encounter (Signed)
Mychart email from pt: Message Body  since thursday I have used Asmanex every night with 2 puffs. The last two nights I had leg cramps almost every hour. About 15 years ago using Advair i had severe leg cramps for which i used Qunine and had very bad rash or allergic reaction to Quinine. What do you suggest. And please do not send perscprition to [facility] until we figure this out.  *pt had sent another email on 05/07/13 at 5:30pm asking for a refill on her Asmanex to [facility].*  Called spoke with patient who stated that after her ov w/ [place] on 3.12.15 she decided to wait about a week before taking her Asmanex on a daily basis; then began taking 1 puff BID.  She did fine on this regimen.  After her 4.16.15 ov w/ KC, she began the Asmanex 2 puffs at bedtime as instructed.  For the past two nights pt reports having bad leg cramps almost hourly, causing her to have to get up and walk around her home to help alleviate the discomfort.  Per her email and conversation with patient, she had stopped Advair previously d/t same symptoms and severe allergic reaction to Quinine.  Please call pt back on her cell w/ recommendations : [phone].  Dr Gwenette Greet please advise, thank you.

## 2013-05-13 NOTE — Telephone Encounter (Signed)
Please call the patient tomorrow to see how she's feeling on the pravastatin

## 2013-05-14 LAB — PULMONARY FUNCTION TEST
DL/VA % pred: 82 %
DL/VA: 3.96 ml/min/mmHg/L
DLCO unc % pred: 77 %
DLCO unc: 18.85 ml/min/mmHg
FEF 25-75 PRE: 2.43 L/s
FEF 25-75 Post: 1.96 L/sec
FEF2575-%Change-Post: -19 %
FEF2575-%Pred-Post: 97 %
FEF2575-%Pred-Pre: 120 %
FEV1-%CHANGE-POST: -5 %
FEV1-%Pred-Post: 102 %
FEV1-%Pred-Pre: 108 %
FEV1-PRE: 2.54 L
FEV1-Post: 2.41 L
FEV1FVC-%Change-Post: 0 %
FEV1FVC-%PRED-PRE: 106 %
FEV6-%CHANGE-POST: -4 %
FEV6-%PRED-POST: 101 %
FEV6-%Pred-Pre: 106 %
FEV6-PRE: 3.13 L
FEV6-Post: 3 L
FEV6FVC-%Change-Post: 0 %
FEV6FVC-%PRED-PRE: 103 %
FEV6FVC-%Pred-Post: 104 %
FVC-%Change-Post: -4 %
FVC-%PRED-POST: 97 %
FVC-%PRED-PRE: 102 %
FVC-POST: 3 L
FVC-Pre: 3.14 L
Post FEV1/FVC ratio: 80 %
Post FEV6/FVC ratio: 100 %
Pre FEV1/FVC ratio: 81 %
Pre FEV6/FVC Ratio: 100 %
RV % pred: 96 %
RV: 2.07 L
TLC % PRED: 98 %
TLC: 4.97 L

## 2013-05-14 NOTE — Telephone Encounter (Signed)
The pt states that she started taking Pravastatin 10 mg at bedtime on Thursday 05/09/13 and that she has not had any side effects thus far. She states that she is going to call me next week to let me know how she is doing on Pravastatin and if she is ok with medications we will refill through [facility] per the pts request.

## 2013-05-24 ENCOUNTER — Other Ambulatory Visit: Payer: Self-pay | Admitting: *Deleted

## 2013-05-24 MED ORDER — PRAVASTATIN SODIUM 10 MG PO TABS: 10.0000 mg | ORAL_TABLET | Freq: Every evening | ORAL | Status: DC

## 2013-05-31 NOTE — Telephone Encounter (Signed)
The pt states that she is doing "just fine" on the decreased dose of Pravastatin and thanked me for calling to check on her. She is advised that if she has any questions or concerns to call us, she verbalized understanding.

## 2013-05-31 NOTE — Telephone Encounter (Signed)
**Note De-identified Lemmie Steinhaus Obfuscation** LMTCB

## 2013-05-31 NOTE — Telephone Encounter (Signed)
Please call the patient to touch base with her. Ask her how she is feeling on the low dose of pravastatin.

## 2013-06-06 ENCOUNTER — Encounter: Payer: Self-pay | Admitting: Pulmonary Disease

## 2013-06-07 MED ORDER — MOMETASONE FUROATE 220 MCG/INH IN AEPB: 2.0000 | INHALATION_SPRAY | Freq: Every day | RESPIRATORY_TRACT | Status: DC

## 2013-06-07 NOTE — Telephone Encounter (Signed)
Duplicate message. 

## 2013-10-11 NOTE — Telephone Encounter (Signed)
The pt states that she is doing "very well" with Pravastatin and has no complaints at this time. She expressed gratitude towards Dr Ron Parker for his concern.

## 2013-10-11 NOTE — Telephone Encounter (Signed)
Please call the patient to see how she is doing with taking her pravastatin.

## 2013-10-31 ENCOUNTER — Other Ambulatory Visit: Payer: Self-pay | Admitting: Dermatology

## 2013-10-31 DIAGNOSIS — D485 Neoplasm of uncertain behavior of skin: Secondary | ICD-10-CM | POA: Diagnosis not present

## 2013-10-31 DIAGNOSIS — L821 Other seborrheic keratosis: Secondary | ICD-10-CM | POA: Diagnosis not present

## 2013-11-04 ENCOUNTER — Ambulatory Visit: Payer: Medicare Other | Admitting: Pulmonary Disease

## 2013-11-13 ENCOUNTER — Encounter: Payer: Self-pay | Admitting: Pulmonary Disease

## 2013-11-13 ENCOUNTER — Ambulatory Visit (INDEPENDENT_AMBULATORY_CARE_PROVIDER_SITE_OTHER): Payer: Medicare Other | Admitting: Pulmonary Disease

## 2013-11-13 VITALS — BP 128/82 | HR 62 | Ht 64.0 in | Wt 127.2 lb

## 2013-11-13 DIAGNOSIS — J4520 Mild intermittent asthma, uncomplicated: Secondary | ICD-10-CM | POA: Diagnosis not present

## 2013-11-13 NOTE — Patient Instructions (Signed)
Would try moving asmanex 2 inhalations to mornings to see if it makes a difference.  If you continue to have leg cramps, let us know and we can try another inhaled medication.   It is very important that you stay on some type of inhaled corticosteroid to keep your asthma controlled and to keep from losing lung function.  The singulair is primarily for allergies, and does not need to be taken unless you are having increased issues with your asthma.  followup with me again in one year if doing well.

## 2013-11-13 NOTE — Assessment & Plan Note (Signed)
The patient currently feels that she is doing well from an asthma standpoint, however she continues to be noncompliant with inhaled corticosteroids and other anti-inflammatories. I had a long discussion with her again about the treatment of asthma, and if we do not control her airway inflammation, she is at risk for worsening airflow obstruction due to airway remodeling. I have asked her to try using her Asmanex in the morning rather than in the evening if it causes cramping during sleep.

## 2013-11-13 NOTE — Progress Notes (Signed)
   Subjective:    Patient ID: Elizabeth Jenkins, female    DOB: [birth year], 68 y.o.   MRN: [id number]  HPI The patient comes in today for follow-up of her known asthma. He has been very difficult to keep her on inhaled corticosteroids, and she has actually quit using Asmanex because she feels it causes cramping at night. She has also quit using Singulair as well. Despite this, she feels that her asthma is adequately controlled, and is taking Zyrtec periodically for allergy symptoms. She has not used her rescue inhaler.   Review of Systems  Constitutional: Negative for fever and unexpected weight change.  HENT: Positive for rhinorrhea and sinus pressure. Negative for congestion, dental problem, ear pain, nosebleeds, postnasal drip, sneezing, sore throat and trouble swallowing.   Eyes: Negative for redness and itching.  Respiratory: Negative for cough, chest tightness, shortness of breath and wheezing.   Cardiovascular: Negative for palpitations and leg swelling.  Gastrointestinal: Negative for nausea and vomiting.  Genitourinary: Negative for dysuria.  Musculoskeletal: Negative for joint swelling.  Skin: Negative for rash.  Neurological: Negative for headaches.  Hematological: Does not bruise/bleed easily.  Psychiatric/Behavioral: Negative for dysphoric mood. The patient is not nervous/anxious.        Objective:   Physical Exam Well-developed female in no acute distress Nose without purulence or discharge noted Neck without lymphadenopathy or thyromegaly Chest totally clear to auscultation, no wheezing Heart exam with regular rate and rhythm Extremities without edema, no cyanosis Alert and oriented, moves all 4 extremities.        Assessment & Plan:

## 2013-11-15 DIAGNOSIS — H35371 Puckering of macula, right eye: Secondary | ICD-10-CM | POA: Diagnosis not present

## 2013-11-18 ENCOUNTER — Encounter: Payer: Self-pay | Admitting: Pulmonary Disease

## 2013-11-18 DIAGNOSIS — H35371 Puckering of macula, right eye: Secondary | ICD-10-CM | POA: Diagnosis not present

## 2013-12-16 NOTE — Telephone Encounter (Signed)
**Note De-Identified Elizabeth Jenkins Obfuscation** At the pts last OV with you on 04/01/13 a 1 year f/u was planned and there is a recall in the pts chart. Is this okay?

## 2013-12-16 NOTE — Telephone Encounter (Signed)
I cannot remember if we made any future appointment for the patient. Please check and talk to me

## 2013-12-17 ENCOUNTER — Other Ambulatory Visit: Payer: Self-pay | Admitting: Cardiology

## 2013-12-17 NOTE — Telephone Encounter (Signed)
That plan is fine.

## 2014-02-04 DIAGNOSIS — N390 Urinary tract infection, site not specified: Secondary | ICD-10-CM | POA: Diagnosis not present

## 2014-02-04 DIAGNOSIS — Z0000 Encounter for general adult medical examination without abnormal findings: Secondary | ICD-10-CM | POA: Diagnosis not present

## 2014-02-04 DIAGNOSIS — E785 Hyperlipidemia, unspecified: Secondary | ICD-10-CM | POA: Diagnosis not present

## 2014-02-04 DIAGNOSIS — E559 Vitamin D deficiency, unspecified: Secondary | ICD-10-CM | POA: Diagnosis not present

## 2014-02-11 DIAGNOSIS — K5730 Diverticulosis of large intestine without perforation or abscess without bleeding: Secondary | ICD-10-CM | POA: Diagnosis not present

## 2014-02-11 DIAGNOSIS — E785 Hyperlipidemia, unspecified: Secondary | ICD-10-CM | POA: Diagnosis not present

## 2014-02-11 DIAGNOSIS — I444 Left anterior fascicular block: Secondary | ICD-10-CM | POA: Diagnosis not present

## 2014-02-11 DIAGNOSIS — E559 Vitamin D deficiency, unspecified: Secondary | ICD-10-CM | POA: Diagnosis not present

## 2014-02-11 DIAGNOSIS — R030 Elevated blood-pressure reading, without diagnosis of hypertension: Secondary | ICD-10-CM | POA: Diagnosis not present

## 2014-02-11 DIAGNOSIS — J45909 Unspecified asthma, uncomplicated: Secondary | ICD-10-CM | POA: Diagnosis not present

## 2014-02-11 DIAGNOSIS — Z6821 Body mass index (BMI) 21.0-21.9, adult: Secondary | ICD-10-CM | POA: Diagnosis not present

## 2014-02-11 DIAGNOSIS — Z008 Encounter for other general examination: Secondary | ICD-10-CM | POA: Diagnosis not present

## 2014-02-11 DIAGNOSIS — M810 Age-related osteoporosis without current pathological fracture: Secondary | ICD-10-CM | POA: Diagnosis not present

## 2014-02-11 DIAGNOSIS — Z8249 Family history of ischemic heart disease and other diseases of the circulatory system: Secondary | ICD-10-CM | POA: Diagnosis not present

## 2014-02-11 DIAGNOSIS — Z1389 Encounter for screening for other disorder: Secondary | ICD-10-CM | POA: Diagnosis not present

## 2014-02-13 DIAGNOSIS — Z1212 Encounter for screening for malignant neoplasm of rectum: Secondary | ICD-10-CM | POA: Diagnosis not present

## 2014-02-21 ENCOUNTER — Other Ambulatory Visit: Payer: Self-pay

## 2014-02-21 DIAGNOSIS — Z1231 Encounter for screening mammogram for malignant neoplasm of breast: Secondary | ICD-10-CM

## 2014-02-24 ENCOUNTER — Ambulatory Visit (INDEPENDENT_AMBULATORY_CARE_PROVIDER_SITE_OTHER): Payer: Medicare Other | Admitting: Cardiology

## 2014-02-24 ENCOUNTER — Encounter: Payer: Self-pay | Admitting: Cardiology

## 2014-02-24 VITALS — BP 144/110 | HR 82 | Ht 64.0 in | Wt 124.4 lb

## 2014-02-24 DIAGNOSIS — R938 Abnormal findings on diagnostic imaging of other specified body structures: Secondary | ICD-10-CM

## 2014-02-24 DIAGNOSIS — R0789 Other chest pain: Secondary | ICD-10-CM | POA: Diagnosis not present

## 2014-02-24 DIAGNOSIS — R931 Abnormal findings on diagnostic imaging of heart and coronary circulation: Secondary | ICD-10-CM

## 2014-02-24 DIAGNOSIS — I10 Essential (primary) hypertension: Secondary | ICD-10-CM

## 2014-02-24 MED ORDER — LOSARTAN POTASSIUM 50 MG PO TABS: 50.0000 mg | ORAL_TABLET | Freq: Every day | ORAL | Status: DC

## 2014-02-24 NOTE — Assessment & Plan Note (Signed)
She is not having any significant recurrent chest pain.

## 2014-02-24 NOTE — Progress Notes (Signed)
Cardiology Office Note   Date:  02/24/2014   ID:  Tyaisha, Cullom [birth year], MRN [id number]  PCP:  Tivis Ringer, MD  Cardiologist:  Dola Argyle, MD   Chief Complaint  Patient presents with  . Appointment    Assess elevated blood pressure      History of Present Illness: Elizabeth Jenkins is a 69 y.o. female who presents today to follow-up her elevated cholesterol and blood pressure. In the past there was question of some blood pressure elevation. More recently her pressure has been elevated at home. She is taken on a blood pressure cuff that she has at home. It was also elevated in the office of Dr. Dagmar Hait, her primary physician. She tells me that he checked her urine and that there is some protein. She also mentions that her overall labs are good. It seems unusual that she would have an unusual cause of hypertension. Also at this point I doubt renovascular hypertension.    Past Medical History  Diagnosis Date  . Cervical intraepithelial neoplasia (CIN)   . Dermoid   . Osteoporosis   . Atrophic vaginitis   . Asthma   . Ovarian cyst     Past Surgical History  Procedure Laterality Date  . [place]one biopsy of cervix    . Tubal ligation    . Excision of benign labial lesion  2008  . Ankle surgery    . Rhinoplasty    . Colposcopy    . Appendectomy      Patient Active Problem List   Diagnosis Date Noted  . Chest discomfort 04/01/2013  . Agatston coronary artery calcium score less than 100 04/01/2013  . Dyslipidemia 04/01/2013  . Abnormal EKG 04/01/2013  . Bronchiectasis without acute exacerbation 03/28/2013  . Ovarian cyst   . Cervical intraepithelial neoplasia (CIN)   . Dermoid   . Osteoporosis   . Atrophic vaginitis   . CHRONIC RHINITIS 01/02/2007  . Intrinsic asthma 01/02/2007      Current Outpatient Prescriptions  Medication Sig Dispense Refill  . albuterol (PROVENTIL HFA;VENTOLIN HFA) 108 (90 BASE) MCG/ACT inhaler Inhale 2 puffs into the lungs every 6  (six) hours as needed for wheezing or shortness of breath.    . cetirizine (ZYRTEC) 10 MG tablet Take 5 mg by mouth daily.    . mometasone (ASMANEX) 220 MCG/INH inhaler Inhale 2 puffs into the lungs daily. 3 Inhaler 3  . mometasone (NASONEX) 50 MCG/ACT nasal spray Place 2 sprays into the nose daily.    . montelukast (SINGULAIR) 10 MG tablet Take 10 mg by mouth at bedtime.    . pravastatin (PRAVACHOL) 10 MG tablet TAKE 1 TABLET EVERY EVENING 90 tablet 2  . zolpidem (AMBIEN) 10 MG tablet Take 10 mg by mouth at bedtime as needed (only when traveling).    Marland Kitchen losartan (COZAAR) 50 MG tablet Take 1 tablet (50 mg total) by mouth daily. 30 tablet 3   No current facility-administered medications for this visit.    Allergies:   Lipitor and Quinine derivatives    Social History:  The patient  reports that she quit smoking about 44 years ago. Her smoking use included Cigarettes. She has a 2.5 pack-year smoking history. She has never used smokeless tobacco. She reports that she drinks about 2.5 oz of alcohol per week. She reports that she does not use illicit drugs.   Family History:  The patient's family history includes Diabetes in her father and mother; Heart disease in her father  and mother; Hypertension in her mother and sister.    ROS:  Please see the history of present illness.  Patient denies fever, chills, headache, sweats, rash, change in vision, change in hearing, chest pain, cough, nausea or vomiting, urinary symptoms. All other systems are reviewed and are negative.   PHYSICAL EXAM: VS:  BP 144/110 mmHg  Pulse 82  Ht 5\' 4"  (1.626 m)  Wt 124 lb 6.4 oz (56.427 kg)  BMI 21.34 kg/m2 , Patient's blood pressure was rechecked in both arms. It was 176/92 in the left arm and 172/95 in the right arm. I then checked her pressure with her using her home cuff. This appeared to be in the range of 187/94. Patient is oriented to person time and place. Affect is normal. Head is atraumatic. Sclera and  conjunctiva are normal. There is no jugular venous distention. Lungs are clear. Respiratory effort is nonlabored. Cardiac exam reveals an S1 and S2. The abdomen is soft. The is no peripheral edema. There are no musculoskeletal form and agrees. There are no skin rashes. Neurologic is grossly intact.  EKG:   EKG is not done today.   Recent Labs: She had labs sent with her primary physician.    Lipid Panel No results found for: CHOL, TRIG, HDL, CHOLHDL, VLDL, LDLCALC, LDLDIRECT    Wt Readings from Last 3 Encounters:  02/24/14 124 lb 6.4 oz (56.427 kg)  11/13/13 127 lb 3.2 oz (57.698 kg)  05/02/13 123 lb (55.792 kg)      Current medicines are reviewed. She understands her medications well.     ASSESSMENT AND PLAN:

## 2014-02-24 NOTE — Patient Instructions (Signed)
**Note De-Identified [name] Obfuscation** Your physician has recommended you make the following change in your medication: start taking Losartan 50 mg daily  Your physician has requested that you regularly monitor and record your blood pressure readings at home. Please use the same machine at different times of day to check your readings and record them, then call Jeani Hawking (Dr Kae Heller nurse) at [phone] in 7 to 10 days to report your blood pressure readings.  Your physician recommends that you schedule a follow-up appointment in: as already planned

## 2014-02-24 NOTE — Assessment & Plan Note (Signed)
Historically there was question of some increase in blood pressure. Her blood pressures as checked in the office and now at home do indicate hypertension. At this point I do not think that any further diagnostic workup is necessary. I believe she has essential hypertension. I've recommended that we start 50 mg of losartan. She will monitor her pressure at home and we will communicate over the phone concerning adjustments in her meds. Over time it will certainly be most appropriate for her to have her pressure followed by Dr. Dagmar Hait who will do an excellent job. She came to me about this today because I know her and her husband quite well and he encouraged the visit with me. She also knows that I will be retiring in January, 2016.

## 2014-02-24 NOTE — Assessment & Plan Note (Signed)
I have outlined before the fact that I would like her to be on a statin if possible. At this point she is tolerating a small dose of Pravachol. This will be continued.

## 2014-03-03 ENCOUNTER — Ambulatory Visit: Payer: Medicare Other

## 2014-03-06 ENCOUNTER — Ambulatory Visit
Admission: RE | Admit: 2014-03-06 | Discharge: 2014-03-06 | Disposition: A | Discharge status: Home / Self Care | Payer: Medicare Other | Source: Ambulatory Visit

## 2014-03-06 DIAGNOSIS — Z1231 Encounter for screening mammogram for malignant neoplasm of breast: Secondary | ICD-10-CM | POA: Diagnosis not present

## 2014-03-09 ENCOUNTER — Encounter: Payer: Self-pay | Admitting: Cardiology

## 2014-03-14 ENCOUNTER — Other Ambulatory Visit: Payer: Self-pay

## 2014-03-14 MED ORDER — LOSARTAN POTASSIUM 100 MG PO TABS: 100.0000 mg | ORAL_TABLET | Freq: Every day | ORAL | Status: DC

## 2014-03-20 DIAGNOSIS — Z01419 Encounter for gynecological examination (general) (routine) without abnormal findings: Secondary | ICD-10-CM | POA: Diagnosis not present

## 2014-03-20 DIAGNOSIS — Z124 Encounter for screening for malignant neoplasm of cervix: Secondary | ICD-10-CM | POA: Diagnosis not present

## 2014-03-30 ENCOUNTER — Encounter: Payer: Self-pay | Admitting: Cardiology

## 2014-03-31 ENCOUNTER — Telehealth: Payer: Self-pay | Admitting: Cardiology

## 2014-03-31 NOTE — Telephone Encounter (Signed)
New message      Pt c/o BP issue: STAT if pt c/o blurred vision, one-sided weakness or slurred speech  1. What are your last 5 BP readings? March 9 152/85; march 10 167/89; march 11 128/79; march 12 161/89; march 13 153/93 2. Are you having any other symptoms (ex. Dizziness, headache, blurred vision, passed out)? no 3. What is your BP issue?  Started bp medication 1 month ago----they doubled the dosage and now she is calling to give another update -----------Pt is going out of town early wed am. If new dosage is called in, please call it in today so that she will have time to pick it up and start it

## 2014-03-31 NOTE — Telephone Encounter (Signed)
The patient sent BP readings in via MyChart- these were forwarded to Dr. Ron Parker and a copy of this phone note was posted in the [place] message.  Losartan was increased to 100 mg daily on 2/26.

## 2014-04-08 NOTE — Telephone Encounter (Signed)
Returned pt's call. Pt concerned that she has not heard back in regards to BP readings she sent through [place]. Pt would like to know if any changes need to be made to her medications based on her readings. Informed pt that Dr. Ron Parker is not in the office today but that I would route this information to him for review and advisement. Pt verbalized understanding and was in agreement with this plan.

## 2014-04-08 NOTE — Telephone Encounter (Signed)
F/U       Pt calling back, states no one has contacted her about the MyChart msg.    Please return pt call.

## 2014-04-09 ENCOUNTER — Ambulatory Visit: Payer: Medicare Other | Admitting: Cardiology

## 2014-04-09 ENCOUNTER — Encounter: Payer: Self-pay | Admitting: Cardiology

## 2014-04-09 ENCOUNTER — Telehealth: Payer: Self-pay | Admitting: Cardiology

## 2014-04-09 MED ORDER — AMLODIPINE BESYLATE 2.5 MG PO TABS: 2.5000 mg | ORAL_TABLET | Freq: Every day | ORAL | Status: DC

## 2014-04-09 NOTE — Telephone Encounter (Signed)
Please call in amlodipine 2.5 mg daily for the patient. I'm pretty sure she gets her meds at [facility]. I did call and speak with her about this today.

## 2014-04-09 NOTE — Telephone Encounter (Signed)
Rx for Amlodipine 2.5 mg #30 with 3 refills sent to [facility]. to fill per the pts request.

## 2014-04-09 NOTE — Telephone Encounter (Addendum)
New message       This is patient's 3rd phone call without getting a response regarding her bp.  She sent one message thru [place] and 2 phone call messages.  Please call

## 2014-04-09 NOTE — Telephone Encounter (Signed)
Deliah Boston Via, LPN at 9/77/4142 39:53 AM     Status: Signed       Expand All Collapse All   Rx for Amlodipine 2.5 mg #30 with 3 refills sent to [facility]. to fill per the pts request.

## 2014-04-09 NOTE — Telephone Encounter (Addendum)
**Note De-identified Zineb Glade Obfuscation**     **Note De-Identified Yanel Dombrosky Obfuscation** Carlena Bjornstad, MD at 04/09/2014 9:55 AM     Status: Signed       Expand All Collapse All   Please call in amlodipine 2.5 mg daily for the patient. I'm pretty sure she gets her meds at [facility]. I did call and speak with her about this today.

## 2014-04-09 NOTE — Progress Notes (Signed)
I had started the patient on losartan 50 mg. Her pressures were still elevated and the dose was increased to 100 mg. Recently she responded to say that her pressure was still elevated at home. There was delay in my returning her calls but I have spoken with her today and she understands. Most recently her pressure does remain elevated. I've chosen and amlodipine 2.5 mg daily. She will start this and monitor her pressure and be in touch. We will respond to her deceased we will use higher doses of amlodipine.  Daryel November, MD

## 2014-04-27 ENCOUNTER — Encounter: Payer: Self-pay | Admitting: Cardiology

## 2014-04-29 ENCOUNTER — Telehealth: Payer: Self-pay | Admitting: *Deleted

## 2014-04-29 MED ORDER — AMLODIPINE BESYLATE 5 MG PO TABS: 5.0000 mg | ORAL_TABLET | Freq: Every day | ORAL | Status: DC

## 2014-04-29 NOTE — Telephone Encounter (Signed)
LM THAT  NEW SCRIPT WAS  SENT  VIA  EPIC  TO [place]  FOR   5 MG OF AMLODIPINE .Adonis Housekeeper

## 2014-05-01 ENCOUNTER — Encounter: Payer: Self-pay | Admitting: Cardiology

## 2014-05-01 NOTE — Progress Notes (Signed)
Patient not feeling well with Losartan 100 and amlodipine 2.5mg . I decided to stop Losartan and increase Amlodipine to 5mg .  Daryel November, MD

## 2014-05-06 ENCOUNTER — Telehealth: Payer: Self-pay | Admitting: Pulmonary Disease

## 2014-05-06 MED ORDER — ALBUTEROL SULFATE HFA 108 (90 BASE) MCG/ACT IN AERS: 2.0000 | INHALATION_SPRAY | Freq: Four times a day (QID) | RESPIRATORY_TRACT | Status: AC | PRN

## 2014-05-06 NOTE — Telephone Encounter (Signed)
Spoke with the pt and notified rx was sent to [facility] for proair  Nothing further needed

## 2014-05-20 ENCOUNTER — Encounter: Payer: Self-pay | Admitting: Cardiology

## 2014-05-21 ENCOUNTER — Encounter: Payer: Self-pay | Admitting: Cardiology

## 2014-05-21 ENCOUNTER — Ambulatory Visit: Payer: Medicare Other | Admitting: Cardiology

## 2014-05-21 NOTE — Progress Notes (Signed)
I recently added amlodipine. The patient said her blood pressures back to [place]. She is getting better control with amlodipine. She mentions that she feels she is not as alert as she would like to be. She also feels that she cannot do a low-level yield because of soreness. Together we wonder if this could be from her Pravachol. The plan for now will be to continue to use amlodipine for blood pressure and hold her Pravachol. She will take more blood pressure measurements and we will speak again about how she is feeling with these changes.  Daryel November, MD

## 2014-05-26 ENCOUNTER — Telehealth: Payer: Self-pay | Admitting: *Deleted

## 2014-05-26 ENCOUNTER — Other Ambulatory Visit: Payer: Self-pay | Admitting: *Deleted

## 2014-05-26 MED ORDER — AMLODIPINE BESYLATE 5 MG PO TABS: 5.0000 mg | ORAL_TABLET | Freq: Every day | ORAL | Status: DC

## 2014-05-26 NOTE — Telephone Encounter (Signed)
Patient called for a long term refill on the amlodipine through the mail order. I made her aware that she needs an appointment. She stated that she spoke with Dr Ron Parker on the phone on Friday and he is aware that she is out of town. She tells me that she will not be back in town until maybe September. I sent her one ninety day supply and she is aware that I will check to see if ok to send more. Please advise. Thanks, MI

## 2014-05-26 NOTE — Telephone Encounter (Signed)
It is ok to grant the pt refills until September, 2016. Thanks.

## 2014-06-26 ENCOUNTER — Encounter: Payer: Self-pay | Admitting: Cardiology

## 2014-07-14 ENCOUNTER — Other Ambulatory Visit: Payer: Self-pay

## 2014-08-26 ENCOUNTER — Other Ambulatory Visit: Payer: Self-pay | Admitting: *Deleted

## 2014-08-26 ENCOUNTER — Other Ambulatory Visit: Payer: Self-pay | Admitting: Cardiology

## 2014-08-26 MED ORDER — AMLODIPINE BESYLATE 5 MG PO TABS: 5.0000 mg | ORAL_TABLET | Freq: Every day | ORAL | Status: DC

## 2014-10-10 ENCOUNTER — Ambulatory Visit (INDEPENDENT_AMBULATORY_CARE_PROVIDER_SITE_OTHER): Payer: Medicare Other | Admitting: Cardiology

## 2014-10-10 ENCOUNTER — Encounter: Payer: Self-pay | Admitting: Cardiology

## 2014-10-10 VITALS — BP 132/80 | HR 70 | Ht 64.0 in | Wt 118.0 lb

## 2014-10-10 DIAGNOSIS — R9431 Abnormal electrocardiogram [ECG] [EKG]: Secondary | ICD-10-CM

## 2014-10-10 DIAGNOSIS — R938 Abnormal findings on diagnostic imaging of other specified body structures: Secondary | ICD-10-CM | POA: Diagnosis not present

## 2014-10-10 DIAGNOSIS — I10 Essential (primary) hypertension: Secondary | ICD-10-CM | POA: Diagnosis not present

## 2014-10-10 DIAGNOSIS — R0789 Other chest pain: Secondary | ICD-10-CM | POA: Diagnosis not present

## 2014-10-10 DIAGNOSIS — R931 Abnormal findings on diagnostic imaging of heart and coronary circulation: Secondary | ICD-10-CM

## 2014-10-10 DIAGNOSIS — E785 Hyperlipidemia, unspecified: Secondary | ICD-10-CM

## 2014-10-10 NOTE — Assessment & Plan Note (Signed)
The patient gets a good response from amlodipine 5 mg. This will be continued.

## 2014-10-10 NOTE — Assessment & Plan Note (Signed)
The patient does not have proven coronary disease. Her ten-year risk score is mildly elevated. I have encouraged her to take a statin. She's had if occult is trying the statins. Most of the time it is been related to a sensation of mental status change. She prefers to not try any further statins at this time.

## 2014-10-10 NOTE — Assessment & Plan Note (Signed)
Patient has an abnormal EKG. There is no acute abnormality. Two-dimensional echo will be done to fully assess LV function.

## 2014-10-10 NOTE — Assessment & Plan Note (Signed)
Patient had mildly elevated calcium score. We have carefully pushed for good blood pressure control. We have tried to start a statin but she does not tolerate the statins. I've considered whether she should have exercise testing. She will have a 2-D echo to assess her LV function. If LV function is completely normal, no further workup will be needed. If she has a wall motion abnormality, we will be more aggressive.

## 2014-10-10 NOTE — Progress Notes (Signed)
Cardiology Office Note   Date:  10/10/2014   ID:  Aaylah, Pokorny 1945/10/18, MRN [id number]  PCP:  Tivis Ringer, MD  Cardiologist:  Dola Argyle, MD   Chief Complaint  Patient presents with  . Appointment    Follow-up hypertension      History of Present Illness: Elizabeth Jenkins is a 69 y.o. female who presents today to follow-up hypertension, family history of coronary disease, mild elevation of a calcium score. We have tried several medications for her blood pressure. Most recently we have tried amlodipine. She tolerates this well and her blood pressure is nicely controlled. She is not having any chest pain or shortness of breath. She had mild elevation of her calcium score. She has a mildly elevated 10 year risk score. I have tried several different statins. She does not tolerate the statins. She prefers to not try any more.    Past Medical History  Diagnosis Date  . Cervical intraepithelial neoplasia (CIN)   . Dermoid   . Osteoporosis   . Atrophic vaginitis   . Asthma   . Ovarian cyst     Past Surgical History  Procedure Laterality Date  . Cone biopsy of cervix    . Tubal ligation    . Excision of benign labial lesion  2008  . Ankle surgery    . Rhinoplasty    . Colposcopy    . Appendectomy      Patient Active Problem List   Diagnosis Date Noted  . Essential hypertension 02/24/2014  . Chest discomfort 04/01/2013  . Agatston coronary artery calcium score less than 100 04/01/2013  . Dyslipidemia 04/01/2013  . Abnormal EKG 04/01/2013  . Bronchiectasis without acute exacerbation 03/28/2013  . Ovarian cyst   . Cervical intraepithelial neoplasia (CIN)   . Dermoid   . Osteoporosis   . Atrophic vaginitis   . CHRONIC RHINITIS 01/02/2007  . Intrinsic asthma 01/02/2007      Current Outpatient Prescriptions  Medication Sig Dispense Refill  . albuterol (PROAIR HFA) 108 (90 BASE) MCG/ACT inhaler Inhale 2 puffs into the lungs every 6 (six) hours as  needed for wheezing or shortness of breath. 3 Inhaler 0  . albuterol (PROVENTIL HFA;VENTOLIN HFA) 108 (90 BASE) MCG/ACT inhaler Inhale 2 puffs into the lungs every 6 (six) hours as needed for wheezing or shortness of breath.    Marland Kitchen amLODipine (NORVASC) 5 MG tablet Take 1 tablet (5 mg total) by mouth daily. 90 tablet 0  . cetirizine (ZYRTEC) 10 MG tablet Take 5 mg by mouth daily.    . mometasone (ASMANEX) 220 MCG/INH inhaler Inhale 2 puffs into the lungs daily. 3 Inhaler 3  . zolpidem (AMBIEN) 10 MG tablet Take 10 mg by mouth at bedtime as needed (only when traveling).     No current facility-administered medications for this visit.    Allergies:   Lipitor and Quinine derivatives    Social History:  The patient  reports that she quit smoking about 44 years ago. Her smoking use included Cigarettes. She has a 2.5 pack-year smoking history. She has never used smokeless tobacco. She reports that she drinks about 2.5 oz of alcohol per week. She reports that she does not use illicit drugs.   Family History:  The patient's family history includes Diabetes in her father and mother; Heart disease in her father and mother; Heart failure in her mother; Hypertension in her mother and sister; Stroke in her father. There is no history of Heart attack.  ROS:  Please see the history of present illness.     Patient denies fever, chills, headache, sweats, rash, change in vision, change in hearing, chest pain, cough, nausea vomiting, urinary symptoms. All other systems are reviewed and are negative.   PHYSICAL EXAM: VS:  BP 132/80 mmHg  Pulse 70  Ht 5\' 4"  (1.626 m)  Wt 118 lb (53.524 kg)  BMI 20.24 kg/m2 , Patient is oriented to person time and place. Affect is normal. Head is atraumatic. Sclera and conjunctiva are normal. There is no jugular venous distention. Lungs are clear. Respiratory effort is not labored. Cardiac exam reveals S1 and S2. Abdomen is soft. There is no peripheral edema. There are no  musculoskeletal deformities. There are no skin rashes.   EKG:   EKG is done today and reviewed by me. There is leftward axis. There is incomplete right bundle-branch block. This is new from her last tracing. There is increased voltage.   Recent Labs: No results found for requested labs within last 365 days.    Lipid Panel No results found for: CHOL, TRIG, HDL, CHOLHDL, VLDL, LDLCALC, LDLDIRECT    Wt Readings from Last 3 Encounters:  10/10/14 118 lb (53.524 kg)  02/24/14 124 lb 6.4 oz (56.427 kg)  11/13/13 127 lb 3.2 oz (57.698 kg)      Current medicines are reviewed  The patient understands her medications.     ASSESSMENT AND PLAN:

## 2014-10-10 NOTE — Patient Instructions (Addendum)
Medication Instructions:  Same-no changes  Labwork: None  Testing/Procedures: Your physician has requested that you have an echocardiogram. Echocardiography is a painless test that uses sound waves to create images of your heart. It provides your doctor with information about the size and shape of your heart and how well your heart's chambers and valves are working. This procedure takes approximately one hour. There are no restrictions for this procedure. Per Dr Ron Parker this test should be done next week (not on Friday)    Follow-Up: Your physician wants you to follow-up in: 1 year with Dr Harrington Challenger. You will receive a reminder letter in the mail two months in advance. If you don't receive a letter, please call our office to schedule the follow-up appointment.

## 2014-10-17 ENCOUNTER — Ambulatory Visit (HOSPITAL_COMMUNITY): Payer: Medicare Other | Attending: Cardiology

## 2014-10-17 ENCOUNTER — Other Ambulatory Visit: Payer: Self-pay

## 2014-10-17 DIAGNOSIS — I351 Nonrheumatic aortic (valve) insufficiency: Secondary | ICD-10-CM | POA: Diagnosis not present

## 2014-10-17 DIAGNOSIS — J45909 Unspecified asthma, uncomplicated: Secondary | ICD-10-CM | POA: Insufficient documentation

## 2014-10-17 DIAGNOSIS — R0789 Other chest pain: Secondary | ICD-10-CM

## 2014-10-17 DIAGNOSIS — I340 Nonrheumatic mitral (valve) insufficiency: Secondary | ICD-10-CM | POA: Insufficient documentation

## 2014-10-17 DIAGNOSIS — E785 Hyperlipidemia, unspecified: Secondary | ICD-10-CM | POA: Diagnosis not present

## 2014-10-17 DIAGNOSIS — Z87891 Personal history of nicotine dependence: Secondary | ICD-10-CM | POA: Diagnosis not present

## 2014-10-17 DIAGNOSIS — Z8249 Family history of ischemic heart disease and other diseases of the circulatory system: Secondary | ICD-10-CM | POA: Diagnosis not present

## 2014-10-17 DIAGNOSIS — I10 Essential (primary) hypertension: Secondary | ICD-10-CM | POA: Insufficient documentation

## 2014-10-17 DIAGNOSIS — R9431 Abnormal electrocardiogram [ECG] [EKG]: Secondary | ICD-10-CM | POA: Diagnosis not present

## 2014-10-19 ENCOUNTER — Encounter: Payer: Self-pay | Admitting: Cardiology

## 2014-10-19 NOTE — Progress Notes (Unsigned)
Echo on 10/17/2015 does show a mild wall motion abnormality. I have contacted the patient. I have arranged for her to be seen by Dr. Harrington Challenger on 10/24/2014. I spoke with Dr. Harrington Challenger. If Dr Harrington Challenger agrees, my thoughts are to proceed with diagnostic cath.  Daryel November, MD

## 2014-10-23 ENCOUNTER — Ambulatory Visit (INDEPENDENT_AMBULATORY_CARE_PROVIDER_SITE_OTHER): Payer: Medicare Other | Admitting: Internal Medicine

## 2014-10-23 ENCOUNTER — Encounter: Payer: Self-pay | Admitting: Internal Medicine

## 2014-10-23 ENCOUNTER — Encounter: Payer: Self-pay | Admitting: *Deleted

## 2014-10-23 VITALS — BP 116/72 | HR 76 | Ht 64.0 in | Wt 119.2 lb

## 2014-10-23 DIAGNOSIS — Z01812 Encounter for preprocedural laboratory examination: Secondary | ICD-10-CM

## 2014-10-23 DIAGNOSIS — E785 Hyperlipidemia, unspecified: Secondary | ICD-10-CM

## 2014-10-23 NOTE — Patient Instructions (Signed)
Your physician recommends that you continue on your current medications as directed. Please refer to the Current Medication list given to you today. Your physician has requested that you have a cardiac catheterization. Cardiac catheterization is used to diagnose and/or treat various heart conditions. Doctors may recommend this procedure for a number of different reasons. The most common reason is to evaluate chest pain. Chest pain can be a symptom of coronary artery disease (CAD), and cardiac catheterization can show whether plaque is narrowing or blocking your heart's arteries. This procedure is also used to evaluate the valves, as well as measure the blood flow and oxygen levels in different parts of your heart. For further information please visit www.cardiosmart.org. Please follow instruction sheet, as given.   

## 2014-10-23 NOTE — Progress Notes (Signed)
Cardiology Office Note   Date:  10/23/2014   ID:  Elizabeth Jenkins 1945-06-11, MRN [id number]  PCP:  Tivis Ringer, MD  Cardiologist:   Dorris Carnes, MD    Pt returns to review echo results     History of Present Illness: Elizabeth Jenkins is a 69 y.o. female with a history of hypertension, family history of coronary disease, mild elevation of a calcium score.  She was previously followed by Veatrice Bourbon.  She was seen recently for BP control Has not tolerated statins in the past  Denies CP  DId have mild elevation of  Her calcium score  Based on this was set up for an echo  This was done on 9/30  LVEF was 45 to 50%  There was hypokinesis of the inferior wall and distal septum   She comes in today to discuss this        Current Outpatient Prescriptions  Medication Sig Dispense Refill  . albuterol (PROAIR HFA) 108 (90 BASE) MCG/ACT inhaler Inhale 2 puffs into the lungs every 6 (six) hours as needed for wheezing or shortness of breath. 3 Inhaler 0  . amLODipine (NORVASC) 5 MG tablet Take 1 tablet (5 mg total) by mouth daily. 90 tablet 0  . cetirizine (ZYRTEC) 10 MG tablet Take 5 mg by mouth daily.    . mometasone (ASMANEX 120 METERED DOSES) 220 MCG/INH inhaler Inhale 2 puffs into the lungs as needed (inhale two puff into lungs as need for seasonal allergies).    . zolpidem (AMBIEN) 10 MG tablet Take 10 mg by mouth at bedtime as needed (only when traveling).     No current facility-administered medications for this visit.    Allergies:   Lipitor and Quinine derivatives   Past Medical History  Diagnosis Date  . Cervical intraepithelial neoplasia (CIN)   . Dermoid   . Osteoporosis   . Atrophic vaginitis   . Asthma   . Ovarian cyst     Past Surgical History  Procedure Laterality Date  . Cone biopsy of cervix    . Tubal ligation    . Excision of benign labial lesion  2008  . Ankle surgery    . Rhinoplasty    . Colposcopy    . Appendectomy       Social History:  The  patient  reports that she quit smoking about 44 years ago. Her smoking use included Cigarettes. She has a 2.5 pack-year smoking history. She has never used smokeless tobacco. She reports that she drinks about 2.5 oz of alcohol per week. She reports that she does not use illicit drugs.   Family History:  The patient's family history includes Diabetes in her father and mother; Heart disease in her father and mother; Heart failure in her mother; Hypertension in her mother and sister; Stroke in her father. There is no history of Heart attack.    ROS:  Please see the history of present illness. All other systems are reviewed and  Negative to the above problem except as noted.    PHYSICAL EXAM: VS:  BP 116/72 mmHg  Pulse 76  Ht 5\' 4"  (1.626 m)  Wt 119 lb 3.2 oz (54.069 kg)  BMI 20.45 kg/m2  GEN: Well nourished, well developed, in no acute distress HEENT: normal Neck: no JVD, carotid bruits, or masses Cardiac: RRR; no murmurs, rubs, or gallops,no edema  Respiratory:  clear to auscultation bilaterally, normal work of breathing GI: soft, nontender, nondistended, + BS  No hepatomegaly  MS: no deformity Moving all extremities   Skin: warm and dry, no rash Neuro:  Strength and sensation are intact Psych: euthymic mood, full affect   EKG:  EKG is not ordered today.   Lipid Panel No results found for: CHOL, TRIG, HDL, CHOLHDL, VLDL, LDLCALC, LDLDIRECT    Wt Readings from Last 3 Encounters:  10/23/14 119 lb 3.2 oz (54.069 kg)  10/10/14 118 lb (53.524 kg)  02/24/14 124 lb 6.4 oz (56.427 kg)      ASSESSMENT AND PLAN:  1.  Abnormal echo.  I spoke to Daryel November prior to the pts visit  He has reviewed the echo  With the echo findings he would recomm L heart cath to define anatomy.   Reviewed procedure with pt and her husband.  They understand risks and benefits and agree wto proceed. Plan for later this month  2.  HL  Will need to review after cath  3.  HTN  Continue current regimen  Good  control  Will arrange for labs once date for procedure made   Dorris Carnes     Signed, Dorris Carnes, MD  10/23/2014 11:57 AM    [facility] [address], [place], [place]  [address] Phone: [phone]; Fax: [phone]

## 2014-10-24 ENCOUNTER — Ambulatory Visit: Payer: Medicare Other | Admitting: Internal Medicine

## 2014-11-03 ENCOUNTER — Telehealth: Payer: Self-pay | Admitting: Internal Medicine

## 2014-11-03 NOTE — Telephone Encounter (Signed)
New message      Pt is having a cath next Tuesday.  She has questions about being put to sleep

## 2014-11-03 NOTE — Telephone Encounter (Signed)
Advised patient that she will be awake during the procedure. She asked questions about anesthesia.  Advised her medicines are administered by the team in the cath lab.  She reports that she is really nervous and she wishes she would have scheduled for this week so that she didn't have to worry about it. However she is going to visit her grandchildren this week and that is why she wants to wait.    This was discussed with Dr. Burt Knack who states Valium can be ordered to be given in short stay for a patient who is nervous about the procedure. Message sent to Dr. Irish Lack asking if he will order for patient.

## 2014-11-04 ENCOUNTER — Other Ambulatory Visit (INDEPENDENT_AMBULATORY_CARE_PROVIDER_SITE_OTHER): Payer: Medicare Other

## 2014-11-04 DIAGNOSIS — R9431 Abnormal electrocardiogram [ECG] [EKG]: Secondary | ICD-10-CM | POA: Diagnosis not present

## 2014-11-04 DIAGNOSIS — I10 Essential (primary) hypertension: Secondary | ICD-10-CM | POA: Diagnosis not present

## 2014-11-04 DIAGNOSIS — E785 Hyperlipidemia, unspecified: Secondary | ICD-10-CM | POA: Diagnosis not present

## 2014-11-04 DIAGNOSIS — Z01810 Encounter for preprocedural cardiovascular examination: Secondary | ICD-10-CM | POA: Diagnosis not present

## 2014-11-04 LAB — BASIC METABOLIC PANEL
BUN: 18 mg/dL (ref 7–25)
CHLORIDE: 104 mmol/L (ref 98–110)
CO2: 30 mmol/L (ref 20–31)
Calcium: 9.2 mg/dL (ref 8.6–10.4)
Creat: 0.75 mg/dL (ref 0.50–0.99)
GLUCOSE: 91 mg/dL (ref 65–99)
Potassium: 4.8 mmol/L (ref 3.5–5.3)
SODIUM: 139 mmol/L (ref 135–146)

## 2014-11-04 LAB — LIPID PANEL
CHOL/HDL RATIO: 2.9 ratio (ref <=5.0)
Cholesterol: 233 mg/dL — ABNORMAL HIGH (ref 125–200)
HDL: 80 mg/dL (ref >=46)
LDL CALC: 140 mg/dL — AB (ref <130)
TRIGLYCERIDES: 63 mg/dL (ref <150)
VLDL: 13 mg/dL (ref <30)

## 2014-11-04 NOTE — Addendum Note (Signed)
Addended by: Velna Ochs on: 11/04/2014 08:34 AM   Modules accepted: Orders

## 2014-11-05 LAB — CBC WITH DIFFERENTIAL/PLATELET

## 2014-11-05 LAB — PROTIME-INR
INR: 0.96 (ref <1.50)
PROTHROMBIN TIME: 12.9 s (ref 11.6–15.2)

## 2014-11-06 ENCOUNTER — Telehealth: Payer: Self-pay | Admitting: Internal Medicine

## 2014-11-06 DIAGNOSIS — Z01812 Encounter for preprocedural laboratory examination: Secondary | ICD-10-CM

## 2014-11-06 NOTE — Telephone Encounter (Signed)
New message  Pt called for lab results. Pt req a call back to determine if the labs that were canceled are still needed. Please assist

## 2014-11-06 NOTE — Telephone Encounter (Signed)
Cbc was cancelled due to clotting. Spoke with patient who is out of town. She will come in on Monday 11/11/14 at 7:30 am to have recollected.

## 2014-11-06 NOTE — Telephone Encounter (Signed)
Informed pt of lab results. Pt wanted to know if lab needed to be repeated that shows cancelled in her results which was her CBC w/ diff. Will forward to Dr. Harrington Challenger to see if she wants this done prior to cath or if ok to do morning of cath. Advised pt I would route to Dr. Harrington Challenger and we would call her back with recommendation.

## 2014-11-10 ENCOUNTER — Telehealth: Payer: Self-pay | Admitting: Internal Medicine

## 2014-11-10 ENCOUNTER — Other Ambulatory Visit (INDEPENDENT_AMBULATORY_CARE_PROVIDER_SITE_OTHER): Payer: Medicare Other | Admitting: *Deleted

## 2014-11-10 ENCOUNTER — Other Ambulatory Visit: Payer: Self-pay | Admitting: Internal Medicine

## 2014-11-10 DIAGNOSIS — Z01812 Encounter for preprocedural laboratory examination: Secondary | ICD-10-CM | POA: Diagnosis not present

## 2014-11-10 DIAGNOSIS — E785 Hyperlipidemia, unspecified: Secondary | ICD-10-CM | POA: Diagnosis not present

## 2014-11-10 NOTE — Telephone Encounter (Signed)
New problem    Pt want to know results of labs that was done today.

## 2014-11-11 ENCOUNTER — Encounter (HOSPITAL_COMMUNITY)
Admission: RE | Disposition: A | Discharge status: Home / Self Care | Payer: Self-pay | Source: Ambulatory Visit | Attending: Interventional Cardiology

## 2014-11-11 ENCOUNTER — Ambulatory Visit (HOSPITAL_COMMUNITY)
Admission: RE | Admit: 2014-11-11 | Discharge: 2014-11-11 | Disposition: A | Discharge status: Home / Self Care | Payer: Medicare Other | Source: Ambulatory Visit | Attending: Interventional Cardiology | Admitting: Interventional Cardiology

## 2014-11-11 ENCOUNTER — Encounter (HOSPITAL_COMMUNITY): Payer: Self-pay | Admitting: Interventional Cardiology

## 2014-11-11 DIAGNOSIS — Z8249 Family history of ischemic heart disease and other diseases of the circulatory system: Secondary | ICD-10-CM | POA: Insufficient documentation

## 2014-11-11 DIAGNOSIS — R931 Abnormal findings on diagnostic imaging of heart and coronary circulation: Secondary | ICD-10-CM | POA: Insufficient documentation

## 2014-11-11 DIAGNOSIS — R938 Abnormal findings on diagnostic imaging of other specified body structures: Secondary | ICD-10-CM

## 2014-11-11 DIAGNOSIS — Z87891 Personal history of nicotine dependence: Secondary | ICD-10-CM | POA: Diagnosis not present

## 2014-11-11 DIAGNOSIS — J45909 Unspecified asthma, uncomplicated: Secondary | ICD-10-CM | POA: Diagnosis not present

## 2014-11-11 DIAGNOSIS — I2510 Atherosclerotic heart disease of native coronary artery without angina pectoris: Secondary | ICD-10-CM | POA: Diagnosis not present

## 2014-11-11 DIAGNOSIS — I10 Essential (primary) hypertension: Secondary | ICD-10-CM | POA: Diagnosis not present

## 2014-11-11 HISTORY — PX: CARDIAC CATHETERIZATION: SHX172

## 2014-11-11 LAB — CBC WITH DIFFERENTIAL/PLATELET
BASOS ABS: 0 10*3/uL (ref 0.0–0.1)
Basophils Relative: 0 % (ref 0–1)
EOS PCT: 3 % (ref 0–5)
Eosinophils Absolute: 0.1 10*3/uL (ref 0.0–0.7)
HEMATOCRIT: 38.9 % (ref 36.0–46.0)
HEMOGLOBIN: 13 g/dL (ref 12.0–15.0)
Lymphocytes Relative: 38 % (ref 12–46)
Lymphs Abs: 1.9 10*3/uL (ref 0.7–4.0)
MCH: 30.1 pg (ref 26.0–34.0)
MCHC: 33.4 g/dL (ref 30.0–36.0)
MCV: 90 fL (ref 78.0–100.0)
MPV: 10.6 fL (ref 8.6–12.4)
Monocytes Absolute: 0.4 10*3/uL (ref 0.1–1.0)
Monocytes Relative: 8 % (ref 3–12)
NEUTROS ABS: 2.5 10*3/uL (ref 1.7–7.7)
NEUTROS PCT: 51 % (ref 43–77)
Platelets: 219 10*3/uL (ref 150–400)
RBC: 4.32 MIL/uL (ref 3.87–5.11)
RDW: 13.7 % (ref 11.5–15.5)
WBC: 4.9 10*3/uL (ref 4.0–10.5)

## 2014-11-11 SURGERY — LEFT HEART CATH AND CORONARY ANGIOGRAPHY

## 2014-11-11 MED ORDER — SODIUM CHLORIDE 0.9 % WEIGHT BASED INFUSION: 3.0000 mL/kg/h | INTRAVENOUS | Status: DC

## 2014-11-11 MED ORDER — ASPIRIN 81 MG PO CHEW
CHEWABLE_TABLET | ORAL | Status: AC
  Filled 2014-11-11: qty 1

## 2014-11-11 MED ORDER — SODIUM CHLORIDE 0.9 % IJ SOLN: 3.0000 mL | INTRAMUSCULAR | Status: DC | PRN

## 2014-11-11 MED ORDER — IOHEXOL 350 MG/ML SOLN
INTRAVENOUS | Status: DC | PRN
  Administered 2014-11-11: 50 mL via INTRA_ARTERIAL

## 2014-11-11 MED ORDER — HEPARIN SODIUM (PORCINE) 1000 UNIT/ML IJ SOLN
INTRAMUSCULAR | Status: DC | PRN
  Administered 2014-11-11: 3000 [IU] via INTRAVENOUS

## 2014-11-11 MED ORDER — HEPARIN SODIUM (PORCINE) 1000 UNIT/ML IJ SOLN
INTRAMUSCULAR | Status: AC
  Filled 2014-11-11: qty 1

## 2014-11-11 MED ORDER — MIDAZOLAM HCL 2 MG/2ML IJ SOLN
INTRAMUSCULAR | Status: AC
  Filled 2014-11-11: qty 4

## 2014-11-11 MED ORDER — MIDAZOLAM HCL 2 MG/2ML IJ SOLN
INTRAMUSCULAR | Status: DC | PRN
  Administered 2014-11-11: 2 mg via INTRAVENOUS

## 2014-11-11 MED ORDER — VERAPAMIL HCL 2.5 MG/ML IV SOLN
INTRAVENOUS | Status: DC | PRN
  Administered 2014-11-11: 08:00:00 via INTRA_ARTERIAL

## 2014-11-11 MED ORDER — FENTANYL CITRATE (PF) 100 MCG/2ML IJ SOLN
INTRAMUSCULAR | Status: DC | PRN
  Administered 2014-11-11: 25 ug via INTRAVENOUS

## 2014-11-11 MED ORDER — VERAPAMIL HCL 2.5 MG/ML IV SOLN
INTRAVENOUS | Status: AC
  Filled 2014-11-11: qty 2

## 2014-11-11 MED ORDER — NITROGLYCERIN 1 MG/10 ML FOR IR/CATH LAB
INTRA_ARTERIAL | Status: AC
  Filled 2014-11-11: qty 10

## 2014-11-11 MED ORDER — SODIUM CHLORIDE 0.9 % IV SOLN: 250.0000 mL | INTRAVENOUS | Status: DC | PRN

## 2014-11-11 MED ORDER — SODIUM CHLORIDE 0.9 % IJ SOLN: 3.0000 mL | Freq: Two times a day (BID) | INTRAMUSCULAR | Status: DC

## 2014-11-11 MED ORDER — FENTANYL CITRATE (PF) 100 MCG/2ML IJ SOLN
INTRAMUSCULAR | Status: AC
  Filled 2014-11-11: qty 4

## 2014-11-11 MED ORDER — SODIUM CHLORIDE 0.9 % IV SOLN
INTRAVENOUS | Status: DC
Start: 2014-11-11 — End: 2014-11-11
  Administered 2014-11-11: 07:00:00 via INTRAVENOUS

## 2014-11-11 MED ORDER — LIDOCAINE HCL (PF) 1 % IJ SOLN
INTRAMUSCULAR | Status: AC
  Filled 2014-11-11: qty 30

## 2014-11-11 MED ORDER — SODIUM CHLORIDE 0.9 % IV SOLN
INTRAVENOUS | Status: DC | PRN
  Administered 2014-11-11: 50 mL/h via INTRAVENOUS

## 2014-11-11 MED ORDER — LIDOCAINE HCL (PF) 1 % IJ SOLN
INTRAMUSCULAR | Status: DC | PRN
  Administered 2014-11-11: 08:00:00

## 2014-11-11 MED ORDER — HEPARIN (PORCINE) IN NACL 2-0.9 UNIT/ML-% IJ SOLN
INTRAMUSCULAR | Status: AC
  Filled 2014-11-11: qty 1000

## 2014-11-11 MED ORDER — ASPIRIN 81 MG PO CHEW
81.0000 mg | CHEWABLE_TABLET | ORAL | Status: AC
  Administered 2014-11-11: 81 mg via ORAL

## 2014-11-11 SURGICAL SUPPLY — 12 items
CATH INFINITI 5 FR JL3.5 (CATHETERS) ×2 IMPLANT
CATH INFINITI 5FR ANG PIGTAIL (CATHETERS) ×2 IMPLANT
CATH INFINITI JR4 5F (CATHETERS) ×2 IMPLANT
DEVICE RAD COMP TR BAND LRG (VASCULAR PRODUCTS) ×2 IMPLANT
GLIDESHEATH SLEND SS 6F .021 (SHEATH) ×2 IMPLANT
KIT HEART LEFT (KITS) ×2 IMPLANT
PACK CARDIAC CATHETERIZATION (CUSTOM PROCEDURE TRAY) ×2 IMPLANT
SYR MEDRAD MARK V 150ML (SYRINGE) ×2 IMPLANT
TRANSDUCER W/STOPCOCK (MISCELLANEOUS) ×2 IMPLANT
TUBING CIL FLEX 10 FLL-RA (TUBING) ×2 IMPLANT
WIRE HI TORQ VERSACORE-J 145CM (WIRE) ×1 IMPLANT
WIRE SAFE-T 1.5MM-J .035X260CM (WIRE) ×2 IMPLANT

## 2014-11-11 NOTE — Discharge Instructions (Signed)
Radial Site Care °Refer to this sheet in the next few weeks. These instructions provide you with information about caring for yourself after your procedure. Your health care provider may also give you more specific instructions. Your treatment has been planned according to current medical practices, but problems sometimes occur. Call your health care provider if you have any problems or questions after your procedure. °WHAT TO EXPECT AFTER THE PROCEDURE °After your procedure, it is typical to have the following: °· Bruising at the radial site that usually fades within 1-2 weeks. °· Blood collecting in the tissue (hematoma) that may be painful to the touch. It should usually decrease in size and tenderness within 1-2 weeks. °HOME CARE INSTRUCTIONS °· Take medicines only as directed by your health care provider. °· You may shower 24-48 hours after the procedure or as directed by your health care provider. Remove the bandage (dressing) and gently wash the site with plain soap and water. Pat the area dry with a clean towel. Do not rub the site, because this may cause bleeding. °· Do not take baths, swim, or use a hot tub until your health care provider approves. °· Check your insertion site every day for redness, swelling, or drainage. °· Do not apply powder or lotion to the site. °· Do not flex or bend the affected arm for 24 hours or as directed by your health care provider. °· Do not push or pull heavy objects with the affected arm for 24 hours or as directed by your health care provider. °· Do not lift over 10 lb (4.5 kg) for 5 days after your procedure or as directed by your health care provider. °· Ask your health care provider when it is okay to: °¨ Return to work or school. °¨ Resume usual physical activities or sports. °¨ Resume sexual activity. °· Do not drive home if you are discharged the same day as the procedure. Have someone else drive you. °· You may drive 24 hours after the procedure unless otherwise  instructed by your health care provider. °· Do not operate machinery or power tools for 24 hours after the procedure. °· If your procedure was done as an outpatient procedure, which means that you went home the same day as your procedure, a responsible adult should be with you for the first 24 hours after you arrive home. °· Keep all follow-up visits as directed by your health care provider. This is important. °SEEK MEDICAL CARE IF: °· You have a fever. °· You have chills. °· You have increased bleeding from the radial site. Hold pressure on the site. °SEEK IMMEDIATE MEDICAL CARE IF: °· You have unusual pain at the radial site. °· You have redness, warmth, or swelling at the radial site. °· You have drainage (other than a small amount of blood on the dressing) from the radial site. °· The radial site is bleeding, and the bleeding does not stop after 30 minutes of holding steady pressure on the site. °· Your arm or hand becomes pale, cool, tingly, or numb. °  °This information is not intended to replace advice given to you by your health care provider. Make sure you discuss any questions you have with your health care provider. °  °Document Released: 02/05/2010 Document Revised: 01/24/2014 Document Reviewed: 07/22/2013 °Elsevier Interactive Patient Education ©2016 [employer] ° °

## 2014-11-11 NOTE — H&P (View-Only) (Signed)
Cardiology Office Note   Date:  10/23/2014   ID:  Elizabeth, Jenkins 11/17/1945, MRN [id number]  PCP:  Elizabeth Ringer, MD  Cardiologist:   Elizabeth Carnes, MD    Pt returns to review echo results     History of Present Illness: Elizabeth Jenkins is a 69 y.o. female with a history of hypertension, family history of coronary disease, mild elevation of a calcium score.  She was previously followed by Elizabeth Jenkins.  She was seen recently for BP control Has not tolerated statins in the past  Denies CP  DId have mild elevation of  Her calcium score  Based on this was set up for an echo  This was done on 9/30  LVEF was 45 to 50%  There was hypokinesis of the inferior wall and distal septum   She comes in today to discuss this        Current Outpatient Prescriptions  Medication Sig Dispense Refill  . albuterol (PROAIR HFA) 108 (90 BASE) MCG/ACT inhaler Inhale 2 puffs into the lungs every 6 (six) hours as needed for wheezing or shortness of breath. 3 Inhaler 0  . amLODipine (NORVASC) 5 MG tablet Take 1 tablet (5 mg total) by mouth daily. 90 tablet 0  . cetirizine (ZYRTEC) 10 MG tablet Take 5 mg by mouth daily.    . mometasone (ASMANEX 120 METERED DOSES) 220 MCG/INH inhaler Inhale 2 puffs into the lungs as needed (inhale two puff into lungs as need for seasonal allergies).    . zolpidem (AMBIEN) 10 MG tablet Take 10 mg by mouth at bedtime as needed (only when traveling).     No current facility-administered medications for this visit.    Allergies:   Lipitor and Quinine derivatives   Past Medical History  Diagnosis Date  . Cervical intraepithelial neoplasia (CIN)   . Dermoid   . Osteoporosis   . Atrophic vaginitis   . Asthma   . Ovarian cyst     Past Surgical History  Procedure Laterality Date  . Cone biopsy of cervix    . Tubal ligation    . Excision of benign labial lesion  2008  . Ankle surgery    . Rhinoplasty    . Colposcopy    . Appendectomy       Social History:  The  patient  reports that she quit smoking about 44 years ago. Her smoking use included Cigarettes. She has a 2.5 pack-year smoking history. She has never used smokeless tobacco. She reports that she drinks about 2.5 oz of alcohol per week. She reports that she does not use illicit drugs.   Family History:  The patient's family history includes Diabetes in her father and mother; Heart disease in her father and mother; Heart failure in her mother; Hypertension in her mother and sister; Stroke in her father. There is no history of Heart attack.    ROS:  Please see the history of present illness. All other systems are reviewed and  Negative to the above problem except as noted.    PHYSICAL EXAM: VS:  BP 116/72 mmHg  Pulse 76  Ht 5\' 4"  (1.626 m)  Wt 119 lb 3.2 oz (54.069 kg)  BMI 20.45 kg/m2  GEN: Well nourished, well developed, in no acute distress HEENT: normal Neck: no JVD, carotid bruits, or masses Cardiac: RRR; no murmurs, rubs, or gallops,no edema  Respiratory:  clear to auscultation bilaterally, normal work of breathing GI: soft, nontender, nondistended, + BS  No hepatomegaly  MS: no deformity Moving all extremities   Skin: warm and dry, no rash Neuro:  Strength and sensation are intact Psych: euthymic mood, full affect   EKG:  EKG is not ordered today.   Lipid Panel No results found for: CHOL, TRIG, HDL, CHOLHDL, VLDL, LDLCALC, LDLDIRECT    Wt Readings from Last 3 Encounters:  10/23/14 119 lb 3.2 oz (54.069 kg)  10/10/14 118 lb (53.524 kg)  02/24/14 124 lb 6.4 oz (56.427 kg)      ASSESSMENT AND PLAN:  1.  Abnormal echo.  I spoke to Elizabeth Jenkins prior to the pts visit  He has reviewed the echo  With the echo findings he would recomm L heart cath to define anatomy.   Reviewed procedure with pt and her husband.  They understand risks and benefits and agree wto proceed. Plan for later this month  2.  HL  Will need to review after cath  3.  HTN  Continue current regimen  Good  control  Will arrange for labs once date for procedure made   Elizabeth Jenkins     Signed, Elizabeth Carnes, MD  10/23/2014 11:57 AM    [facility] [place], [place],   [address] Phone: [phone]; Fax: [phone]

## 2014-11-11 NOTE — Interval H&P Note (Signed)
Cath Lab Visit (complete for each Cath Lab visit)  Clinical Evaluation Leading to the Procedure:   ACS: No.  Non-ACS:    Anginal Classification: CCS II  Anti-ischemic medical therapy: Minimal Therapy (1 class of medications)  Non-Invasive Test Results: Intermediate-risk stress test findings: cardiac mortality 1-3%/year  Prior CABG: No previous CABG      History and Physical Interval Note:  11/11/2014 7:58 AM  Elizabeth Jenkins  has presented today for surgery, with the diagnosis of abnormal echo  The various methods of treatment have been discussed with the patient and family. After consideration of risks, benefits and other options for treatment, the patient has consented to  Procedure(s): Left Heart Cath and Coronary Angiography (N/A) as a surgical intervention .  The patient's history has been reviewed, patient examined, no change in status, stable for surgery.  I have reviewed the patient's chart and labs.  Questions were answered to the patient's satisfaction.     Ellanie Oppedisano S.

## 2014-11-11 NOTE — Research (Signed)
CAD LAD Informed Consent   Subject Name: Elizabeth Jenkins  Subject met inclusion and exclusion criteria.  The informed consent form, study requirements and expectations were reviewed with the subject and questions and concerns were addressed prior to the signing of the consent form.  The subject verbalized understanding of the trail requirements.  The subject agreed to participate in the CAD LAD trial and signed the informed consent.  The informed consent was obtained prior to performance of any protocol-specific procedures for the subject.  A copy of the signed informed consent was given to the subject and a copy was placed in the subject's medical record.  Hedrick,Tammy W 11/11/2014, 0708  

## 2014-11-14 DIAGNOSIS — Z23 Encounter for immunization: Secondary | ICD-10-CM | POA: Diagnosis not present

## 2014-12-05 ENCOUNTER — Telehealth: Payer: Self-pay | Admitting: Internal Medicine

## 2014-12-05 MED ORDER — AMLODIPINE BESYLATE 5 MG PO TABS: 5.0000 mg | ORAL_TABLET | Freq: Every day | ORAL | Status: DC

## 2014-12-05 NOTE — Telephone Encounter (Signed)
Pt's Rx was sent to pt's pharmacy as requested. Confirmation received.  °

## 2014-12-19 DIAGNOSIS — J019 Acute sinusitis, unspecified: Secondary | ICD-10-CM | POA: Diagnosis not present

## 2014-12-19 DIAGNOSIS — R05 Cough: Secondary | ICD-10-CM | POA: Diagnosis not present

## 2014-12-19 DIAGNOSIS — J449 Chronic obstructive pulmonary disease, unspecified: Secondary | ICD-10-CM | POA: Diagnosis not present

## 2014-12-19 DIAGNOSIS — Z6820 Body mass index (BMI) 20.0-20.9, adult: Secondary | ICD-10-CM | POA: Diagnosis not present

## 2014-12-19 DIAGNOSIS — J45909 Unspecified asthma, uncomplicated: Secondary | ICD-10-CM | POA: Diagnosis not present

## 2015-01-22 ENCOUNTER — Telehealth: Payer: Self-pay | Admitting: Internal Medicine

## 2015-01-22 NOTE — Telephone Encounter (Signed)
New message      Pt had a cath in oct.  The doctor who did the cath recommended she start a statin.  She want to talk to Dr Harrington Challenger about starting it.

## 2015-01-22 NOTE — Telephone Encounter (Signed)
Spoke to patient.  Dr. Irish Lack rec statin therapy after her cath in October.  She has tried Lipitor in the past but had body aches and mental sluggishness on it.  Would like to discuss this with Dr. Harrington Challenger.  I did schedule her for f/u with Dr. Harrington Challenger for Jan 30; however, pt would like to discuss this matter on the phone if possible.  She is aware I am forwarding to Dr. Harrington Challenger for any new recommendations; and will call her back.

## 2015-02-16 ENCOUNTER — Encounter: Payer: Self-pay | Admitting: Internal Medicine

## 2015-02-16 ENCOUNTER — Ambulatory Visit (INDEPENDENT_AMBULATORY_CARE_PROVIDER_SITE_OTHER): Payer: Medicare Other | Admitting: Internal Medicine

## 2015-02-16 VITALS — BP 138/74 | HR 77 | Ht 64.0 in | Wt 120.0 lb

## 2015-02-16 DIAGNOSIS — I2510 Atherosclerotic heart disease of native coronary artery without angina pectoris: Secondary | ICD-10-CM

## 2015-02-16 DIAGNOSIS — E785 Hyperlipidemia, unspecified: Secondary | ICD-10-CM

## 2015-02-16 DIAGNOSIS — I10 Essential (primary) hypertension: Secondary | ICD-10-CM

## 2015-02-16 MED ORDER — ROSUVASTATIN CALCIUM 5 MG PO TABS: 5.0000 mg | ORAL_TABLET | Freq: Every day | ORAL | Status: DC

## 2015-02-16 NOTE — Patient Instructions (Addendum)
Your physician has recommended you make the following change in your medication:  1.) Crestor (rosuvastatin) 5mg  tablet take one tablet every other day as directed by Dr. Harrington Challenger.  I have sent this to [facility].  I have scheduled a lab appointment for you for April 16, 2015.  Please arrive fasting.  The lab is open between 7:30 am and 5:00 pm. Come anytime at your convenience that day. Please call and reschedule if this is not a convenient day for you.  Your physician wants you to follow-up in: December, 2017 with Dr. Harrington Challenger.  You will receive a reminder letter in the mail two months in advance. If you don't receive a letter, please call our office to schedule the follow-up appointment.

## 2015-02-16 NOTE — Progress Notes (Signed)
Cardiology Office Note   Date:  02/16/2015   ID:  Charrie, Eiken 02-12-1945, MRN [id number]  PCP:  Tivis Ringer, MD  Cardiologist:   Dorris Carnes, MD   F/U of CAD     History of Present Illness: Elizabeth Jenkins is a 70 y.o. female with a history of HTN and mil elevation of Ca score  She was previously followed by Davina Poke in fall  No CP  Echo with LVEF 45 to 50% with inferoer and distal laterl hypokiensi .  Went on to have L heart cath  This showed 20% LAD lesion  Otherwise OK  Normal LV function    LAst lipid in Octobet LDL ws 140    The pt denies CP  Breathing is OK  No dizzienss  Remains very acite   Did not tolerate lipitor in past due t oachiness    Current Outpatient Prescriptions  Medication Sig Dispense Refill  . albuterol (PROAIR HFA) 108 (90 BASE) MCG/ACT inhaler Inhale 2 puffs into the lungs every 6 (six) hours as needed for wheezing or shortness of breath. 3 Inhaler 0  . amLODipine (NORVASC) 5 MG tablet Take 1 tablet (5 mg total) by mouth daily. 90 tablet 3  . cetirizine (ZYRTEC) 10 MG tablet Take 5 mg by mouth daily.    . mometasone (ASMANEX 120 METERED DOSES) 220 MCG/INH inhaler Inhale 2 puffs into the lungs as needed (inhale two puff into lungs as need for seasonal allergies).    . zolpidem (AMBIEN) 10 MG tablet Take 10 mg by mouth at bedtime as needed (only when traveling).     No current facility-administered medications for this visit.    Allergies:   Lipitor and Quinine derivatives   Past Medical History  Diagnosis Date  . Cervical intraepithelial neoplasia (CIN)   . Dermoid   . Osteoporosis   . Atrophic vaginitis   . Asthma   . Ovarian cyst     Past Surgical History  Procedure Laterality Date  . Cone biopsy of cervix    . Tubal ligation    . Excision of benign labial lesion  2008  . Ankle surgery    . Rhinoplasty    . Colposcopy    . Appendectomy    . Cardiac catheterization N/A 11/11/2014    Procedure: Left Heart Cath and  Coronary Angiography;  Surgeon: Jettie Booze, MD;  Location: [facility];  Service: Cardiovascular;  Laterality: N/A;     Social History:  The patient  reports that she quit smoking about 45 years ago. Her smoking use included Cigarettes. She has a 2.5 pack-year smoking history. She has never used smokeless tobacco. She reports that she drinks about 2.5 oz of alcohol per week. She reports that she does not use illicit drugs.   Family History:  The patient's family history includes Diabetes in her father and mother; Heart disease in her father and mother; Heart failure in her mother; Hypertension in her mother and sister; Stroke in her father. There is no history of Heart attack.    ROS:  Please see the history of present illness. All other systems are reviewed and  Negative to the above problem except as noted.    PHYSICAL EXAM: VS:  BP 138/74 mmHg  Pulse 77  Ht 5\' 4"  (1.626 m)  Wt 120 lb (54.432 kg)  BMI 20.59 kg/m2  SpO2 96%  GEN: Well nourished, well developed, in no acute distress HEENT: normal  Neck: no JVD, carotid bruits, or masses Cardiac: RRR; no murmurs, rubs, or gallops,no edema  Respiratory:  clear to auscultation bilaterally, normal work of breathing GI: soft, nontender, nondistended, + BS  No hepatomegaly  MS: no deformity Moving all extremities   Skin: warm and dry, no rash Neuro:  Strength and sensation are intact Psych: euthymic mood, full affect   EKG:  EKG is not ordered today.   Lipid Panel    Component Value Date/Time   CHOL 233* 11/04/2014 0834   TRIG 63 11/04/2014 0834   HDL 80 11/04/2014 0834   CHOLHDL 2.9 11/04/2014 0834   VLDL 13 11/04/2014 0834   LDLCALC 140* 11/04/2014 0834      Wt Readings from Last 3 Encounters:  02/16/15 120 lb (54.432 kg)  11/11/14 118 lb (53.524 kg)  10/23/14 119 lb 3.2 oz (54.069 kg)      ASSESSMENT AND PLAN:  1  CAD  Minimal plaquing at cath last fall  LVEF normal Would recomm 81 asa and  statin  2.  HL  Will set up to take Crestor 5 mg qod  F/U lipids in 8 wks with AST    3.  HTN  Good control   Encouraged her to stay active   I will see her in clinic in about 1 year  F/U with labs in between.       Signed, Dorris Carnes, MD  02/16/2015 9:01 AM    [address], [place], [place]  [address] Phone: [phone]; Fax: [phone]

## 2015-02-20 ENCOUNTER — Telehealth: Payer: Self-pay | Admitting: Internal Medicine

## 2015-02-20 NOTE — Telephone Encounter (Signed)
°  New  Problem   Pt stated she received message from [facility] regarding her Crestor, that they need more information and they have tried to call us and we haven't responded. Pt want [place] to call [facility] so she can get her medicine. Please call pt is any question b/c she didn't have a number or person name to give me.

## 2015-02-20 NOTE — Telephone Encounter (Signed)
Follow up     Prior authorization [facility]  -[id number] - Crestor.    Patient is asking for a call back .

## 2015-02-24 NOTE — Telephone Encounter (Signed)
Form faxed to [facility] for coverage review.

## 2015-02-25 ENCOUNTER — Telehealth: Payer: Self-pay

## 2015-02-25 ENCOUNTER — Other Ambulatory Visit: Payer: Self-pay

## 2015-02-25 NOTE — Telephone Encounter (Signed)
Spoke with patient about request for Crestor 5 mg to [facility]. I was on the phone for quite a while only to find out it was denied. I advised her of this, and she would like to pursue an appeal. I  Told her I would need a short letter from her appointing me as her patient advocate. She agreed, and will hand deliver this.

## 2015-03-03 ENCOUNTER — Other Ambulatory Visit: Payer: Self-pay

## 2015-03-03 MED ORDER — ROSUVASTATIN CALCIUM 5 MG PO TABS: 5.0000 mg | ORAL_TABLET | ORAL | Status: DC

## 2015-03-04 DIAGNOSIS — L249 Irritant contact dermatitis, unspecified cause: Secondary | ICD-10-CM | POA: Diagnosis not present

## 2015-03-05 DIAGNOSIS — E784 Other hyperlipidemia: Secondary | ICD-10-CM | POA: Diagnosis not present

## 2015-03-05 DIAGNOSIS — J302 Other seasonal allergic rhinitis: Secondary | ICD-10-CM | POA: Diagnosis not present

## 2015-03-05 DIAGNOSIS — J0100 Acute maxillary sinusitis, unspecified: Secondary | ICD-10-CM | POA: Diagnosis not present

## 2015-03-05 DIAGNOSIS — E559 Vitamin D deficiency, unspecified: Secondary | ICD-10-CM | POA: Diagnosis not present

## 2015-03-05 DIAGNOSIS — Z6820 Body mass index (BMI) 20.0-20.9, adult: Secondary | ICD-10-CM | POA: Diagnosis not present

## 2015-03-09 DIAGNOSIS — K5730 Diverticulosis of large intestine without perforation or abscess without bleeding: Secondary | ICD-10-CM | POA: Diagnosis not present

## 2015-03-09 DIAGNOSIS — J028 Acute pharyngitis due to other specified organisms: Secondary | ICD-10-CM | POA: Diagnosis not present

## 2015-03-09 DIAGNOSIS — I2510 Atherosclerotic heart disease of native coronary artery without angina pectoris: Secondary | ICD-10-CM | POA: Diagnosis not present

## 2015-03-09 DIAGNOSIS — J302 Other seasonal allergic rhinitis: Secondary | ICD-10-CM | POA: Diagnosis not present

## 2015-03-09 DIAGNOSIS — E784 Other hyperlipidemia: Secondary | ICD-10-CM | POA: Diagnosis not present

## 2015-03-09 DIAGNOSIS — Z681 Body mass index (BMI) 19 or less, adult: Secondary | ICD-10-CM | POA: Diagnosis not present

## 2015-03-09 DIAGNOSIS — I10 Essential (primary) hypertension: Secondary | ICD-10-CM | POA: Diagnosis not present

## 2015-03-09 DIAGNOSIS — Z1389 Encounter for screening for other disorder: Secondary | ICD-10-CM | POA: Diagnosis not present

## 2015-03-09 DIAGNOSIS — M810 Age-related osteoporosis without current pathological fracture: Secondary | ICD-10-CM | POA: Diagnosis not present

## 2015-03-09 DIAGNOSIS — I444 Left anterior fascicular block: Secondary | ICD-10-CM | POA: Diagnosis not present

## 2015-03-09 DIAGNOSIS — Z0000 Encounter for general adult medical examination without abnormal findings: Secondary | ICD-10-CM | POA: Diagnosis not present

## 2015-03-09 DIAGNOSIS — J449 Chronic obstructive pulmonary disease, unspecified: Secondary | ICD-10-CM | POA: Diagnosis not present

## 2015-03-13 DIAGNOSIS — Z1212 Encounter for screening for malignant neoplasm of rectum: Secondary | ICD-10-CM | POA: Diagnosis not present

## 2015-03-24 ENCOUNTER — Other Ambulatory Visit: Payer: Self-pay

## 2015-03-24 DIAGNOSIS — Z1231 Encounter for screening mammogram for malignant neoplasm of breast: Secondary | ICD-10-CM

## 2015-04-07 DIAGNOSIS — J449 Chronic obstructive pulmonary disease, unspecified: Secondary | ICD-10-CM | POA: Diagnosis not present

## 2015-04-07 DIAGNOSIS — R509 Fever, unspecified: Secondary | ICD-10-CM | POA: Diagnosis not present

## 2015-04-07 DIAGNOSIS — Z681 Body mass index (BMI) 19 or less, adult: Secondary | ICD-10-CM | POA: Diagnosis not present

## 2015-04-07 DIAGNOSIS — J029 Acute pharyngitis, unspecified: Secondary | ICD-10-CM | POA: Diagnosis not present

## 2015-04-09 ENCOUNTER — Ambulatory Visit: Payer: Medicare Other

## 2015-04-13 ENCOUNTER — Ambulatory Visit: Payer: Medicare Other

## 2015-04-16 ENCOUNTER — Other Ambulatory Visit: Payer: Medicare Other

## 2015-04-16 DIAGNOSIS — Z124 Encounter for screening for malignant neoplasm of cervix: Secondary | ICD-10-CM | POA: Diagnosis not present

## 2015-04-17 ENCOUNTER — Ambulatory Visit
Admission: RE | Admit: 2015-04-17 | Discharge: 2015-04-17 | Disposition: A | Discharge status: Home / Self Care | Payer: Medicare Other | Source: Ambulatory Visit

## 2015-04-17 DIAGNOSIS — Z1231 Encounter for screening mammogram for malignant neoplasm of breast: Secondary | ICD-10-CM

## 2015-04-20 ENCOUNTER — Telehealth: Payer: Self-pay

## 2015-04-20 NOTE — Telephone Encounter (Signed)
Appeal of Crestor 5 mg overturned. [facility] is now covering it.

## 2015-04-22 ENCOUNTER — Other Ambulatory Visit: Payer: Self-pay | Admitting: *Deleted

## 2015-04-22 ENCOUNTER — Other Ambulatory Visit: Payer: Self-pay | Admitting: Internal Medicine

## 2015-04-22 DIAGNOSIS — R928 Other abnormal and inconclusive findings on diagnostic imaging of breast: Secondary | ICD-10-CM

## 2015-04-22 DIAGNOSIS — E785 Hyperlipidemia, unspecified: Secondary | ICD-10-CM

## 2015-04-23 DIAGNOSIS — J302 Other seasonal allergic rhinitis: Secondary | ICD-10-CM | POA: Diagnosis not present

## 2015-04-23 DIAGNOSIS — J029 Acute pharyngitis, unspecified: Secondary | ICD-10-CM | POA: Diagnosis not present

## 2015-04-23 DIAGNOSIS — Z6820 Body mass index (BMI) 20.0-20.9, adult: Secondary | ICD-10-CM | POA: Diagnosis not present

## 2015-04-23 DIAGNOSIS — I2510 Atherosclerotic heart disease of native coronary artery without angina pectoris: Secondary | ICD-10-CM | POA: Diagnosis not present

## 2015-04-29 ENCOUNTER — Other Ambulatory Visit: Payer: Medicare Other

## 2015-04-30 ENCOUNTER — Ambulatory Visit
Admission: RE | Admit: 2015-04-30 | Discharge: 2015-04-30 | Disposition: A | Discharge status: Home / Self Care | Payer: Medicare Other | Source: Ambulatory Visit | Attending: Internal Medicine | Admitting: Internal Medicine

## 2015-04-30 DIAGNOSIS — R922 Inconclusive mammogram: Secondary | ICD-10-CM | POA: Diagnosis not present

## 2015-04-30 DIAGNOSIS — N6489 Other specified disorders of breast: Secondary | ICD-10-CM | POA: Diagnosis not present

## 2015-04-30 DIAGNOSIS — R928 Other abnormal and inconclusive findings on diagnostic imaging of breast: Secondary | ICD-10-CM

## 2015-08-19 DIAGNOSIS — M25551 Pain in right hip: Secondary | ICD-10-CM | POA: Diagnosis not present

## 2015-08-19 DIAGNOSIS — M7061 Trochanteric bursitis, right hip: Secondary | ICD-10-CM | POA: Diagnosis not present

## 2015-08-21 ENCOUNTER — Other Ambulatory Visit: Payer: Self-pay

## 2015-08-21 MED ORDER — ROSUVASTATIN CALCIUM 5 MG PO TABS: 5.0000 mg | ORAL_TABLET | ORAL | 1 refills | Status: DC

## 2015-08-31 ENCOUNTER — Telehealth: Payer: Self-pay | Admitting: *Deleted

## 2015-08-31 MED ORDER — ROSUVASTATIN CALCIUM 5 MG PO TABS
5.0000 mg | ORAL_TABLET | ORAL | 1 refills | Status: DC
Start: 2015-08-31 — End: 2015-11-23

## 2015-08-31 NOTE — Telephone Encounter (Signed)
RX sent in for Rosuvastatin, pharmacy ask if they can give pt generic, please advise and I can send them a message. Thanks    Received above message from refills.    I cannot see a reason why she can't have generic.  Pharmacy can fill rosuvastatin, generic for Crestor.

## 2015-08-31 NOTE — Telephone Encounter (Signed)
You can inform pharmacy, thank you! (Routing comment)     Rodman Key, RN 40 minutes ago (2:48 PM)    RX sent in for Rosuvastatin, pharmacy ask if they can give pt generic, please advise and I can send them a message. Thanks    Received above message from refills.    I cannot see a reason why she can't have generic.  Pharmacy can fill rosuvastatin, generic for Crestor.

## 2015-09-01 DIAGNOSIS — M24571 Contracture, right ankle: Secondary | ICD-10-CM | POA: Diagnosis not present

## 2015-09-01 DIAGNOSIS — M19071 Primary osteoarthritis, right ankle and foot: Secondary | ICD-10-CM | POA: Diagnosis not present

## 2015-09-01 DIAGNOSIS — M25571 Pain in right ankle and joints of right foot: Secondary | ICD-10-CM | POA: Diagnosis not present

## 2015-09-02 ENCOUNTER — Telehealth: Payer: Self-pay | Admitting: Internal Medicine

## 2015-09-02 NOTE — Telephone Encounter (Signed)
Can have brand name for Crestor

## 2015-09-02 NOTE — Telephone Encounter (Signed)
called [facility] back to let them know pt can have either Generic or Brand which ever she perfers. Spoke with Virgilio Belling.

## 2015-09-02 NOTE — Telephone Encounter (Signed)
New message      Ms. Elizabeth Jenkins calling stated that the pt is insisting that she needs the brand Crestor 5mg  but the doctor requested the generic. Please call.

## 2015-09-03 NOTE — Telephone Encounter (Signed)
Paperwork arrived via fax for a PA for [employer]. I spoke with patient to verify that she did indeed try the generic. Faxed the PA to [facility].

## 2015-09-11 ENCOUNTER — Telehealth: Payer: Self-pay

## 2015-09-11 NOTE — Telephone Encounter (Signed)
Crestor denied by [facility]. Patient will receive a letter as well. I don't believe she wants to appeal it.

## 2015-09-24 DIAGNOSIS — M19071 Primary osteoarthritis, right ankle and foot: Secondary | ICD-10-CM | POA: Diagnosis not present

## 2015-09-24 DIAGNOSIS — M7661 Achilles tendinitis, right leg: Secondary | ICD-10-CM | POA: Diagnosis not present

## 2015-09-29 DIAGNOSIS — M25551 Pain in right hip: Secondary | ICD-10-CM | POA: Diagnosis not present

## 2015-09-29 DIAGNOSIS — M19071 Primary osteoarthritis, right ankle and foot: Secondary | ICD-10-CM | POA: Diagnosis not present

## 2015-09-29 DIAGNOSIS — M25571 Pain in right ankle and joints of right foot: Secondary | ICD-10-CM | POA: Diagnosis not present

## 2015-09-29 DIAGNOSIS — M1611 Unilateral primary osteoarthritis, right hip: Secondary | ICD-10-CM | POA: Diagnosis not present

## 2015-10-01 DIAGNOSIS — S86011A Strain of right Achilles tendon, initial encounter: Secondary | ICD-10-CM | POA: Diagnosis not present

## 2015-10-05 ENCOUNTER — Telehealth: Payer: Self-pay | Admitting: Internal Medicine

## 2015-10-05 NOTE — Telephone Encounter (Signed)
New Message  Pt call requesting to speak with RN. Pt states she was to have blood work completed in 3/17, but did not make this appt because she has been out of town. Pt wants to know if she still needs to complete the blood work. Please call back to discuss

## 2015-10-05 NOTE — Telephone Encounter (Signed)
Left a message for patient to call back. 

## 2015-10-05 NOTE — Telephone Encounter (Signed)
F/u ° ° ° ° ° °Pt returning nurse call.  °

## 2015-10-06 DIAGNOSIS — M25571 Pain in right ankle and joints of right foot: Secondary | ICD-10-CM | POA: Diagnosis not present

## 2015-10-08 NOTE — Telephone Encounter (Signed)
Left a message for patient to call back. 

## 2015-10-09 NOTE — Telephone Encounter (Signed)
Follow up  Pt voiced she is returning nurses call.  Please f/u with pt

## 2015-10-13 ENCOUNTER — Other Ambulatory Visit: Payer: Medicare Other | Admitting: *Deleted

## 2015-10-13 DIAGNOSIS — E785 Hyperlipidemia, unspecified: Secondary | ICD-10-CM | POA: Diagnosis not present

## 2015-10-13 LAB — LIPID PANEL
CHOLESTEROL: 179 mg/dL (ref 125–200)
HDL: 83 mg/dL (ref >=46)
LDL CALC: 85 mg/dL (ref <130)
TRIGLYCERIDES: 56 mg/dL (ref <150)
Total CHOL/HDL Ratio: 2.2 Ratio (ref <=5.0)
VLDL: 11 mg/dL (ref <30)

## 2015-10-13 LAB — CBC
HEMATOCRIT: 40.1 % (ref 35.0–45.0)
HEMOGLOBIN: 13.6 g/dL (ref 11.7–15.5)
MCH: 30.9 pg (ref 27.0–33.0)
MCHC: 33.9 g/dL (ref 32.0–36.0)
MCV: 91.1 fL (ref 80.0–100.0)
MPV: 9.8 fL (ref 7.5–12.5)
Platelets: 187 10*3/uL (ref 140–400)
RBC: 4.4 MIL/uL (ref 3.80–5.10)
RDW: 13.9 % (ref 11.0–15.0)
WBC: 5.6 10*3/uL (ref 3.8–10.8)

## 2015-10-13 LAB — AST: AST: 19 U/L (ref 10–35)

## 2015-10-14 ENCOUNTER — Telehealth: Payer: Self-pay | Admitting: Internal Medicine

## 2015-10-14 NOTE — Telephone Encounter (Signed)
Spoke with pt and reviewed lab results with her.

## 2015-10-14 NOTE — Telephone Encounter (Signed)
Follow up   ° ° ° °Patient returning call back to triage nurse from today.  °

## 2015-10-15 DIAGNOSIS — M25571 Pain in right ankle and joints of right foot: Secondary | ICD-10-CM | POA: Diagnosis not present

## 2015-10-15 NOTE — Telephone Encounter (Signed)
Pt has been

## 2015-10-15 NOTE — Telephone Encounter (Signed)
Pt was given lab results 

## 2015-10-18 DIAGNOSIS — Z23 Encounter for immunization: Secondary | ICD-10-CM | POA: Diagnosis not present

## 2015-10-19 DIAGNOSIS — M25551 Pain in right hip: Secondary | ICD-10-CM | POA: Diagnosis not present

## 2015-10-19 DIAGNOSIS — M7061 Trochanteric bursitis, right hip: Secondary | ICD-10-CM | POA: Diagnosis not present

## 2015-11-03 ENCOUNTER — Telehealth: Payer: Self-pay | Admitting: Internal Medicine

## 2015-11-03 NOTE — Telephone Encounter (Signed)
New message      Pt c/o medication issue:  1. Name of Medication: generic crestor 2. How are you currently taking this medication (dosage and times per day)? 5mg  every other day 3. Are you having a reaction (difficulty breathing--STAT)? no  4. What is your medication issue? For the last 2 weeks, pt has had leg cramps at night on an hourly basis. She eats bananas daily, drink low potassium V8 juice and nothing seems to help.  Please advise

## 2015-11-03 NOTE — Telephone Encounter (Signed)
Left message for patient to call back  

## 2015-11-04 NOTE — Telephone Encounter (Signed)
Follow Up:; ° ° °Returning your call. °

## 2015-11-04 NOTE — Telephone Encounter (Signed)
Agree with plans  I would stop for a few wks   Have pt call back with how she is feeling

## 2015-11-04 NOTE — Telephone Encounter (Signed)
Patient reports leg cramps on nightly basis almost every hour for about 2 weeks.  She has had leg cramps for the last 10 years or more but much worse recently.  Believes it has to do with the generic Crestor she has been on for last few months, 5 mg every other day.  She has other muscle pain but it is her normal, not worse.  I advised to stop rosuvastatin altogether for 1-2 weeks and see if the leg cramps stop.  I asked her to call back with an update.  I advised if it is not the statin she will need to contact PCP.  Will route to Dr. Harrington Challenger to inform and for any further recommendations.

## 2015-11-19 ENCOUNTER — Encounter: Payer: Self-pay | Admitting: Physician Assistant

## 2015-11-19 DIAGNOSIS — M25571 Pain in right ankle and joints of right foot: Secondary | ICD-10-CM | POA: Diagnosis not present

## 2015-11-23 ENCOUNTER — Encounter: Payer: Self-pay | Admitting: Physician Assistant

## 2015-11-23 ENCOUNTER — Ambulatory Visit (INDEPENDENT_AMBULATORY_CARE_PROVIDER_SITE_OTHER): Payer: Medicare Other | Admitting: Physician Assistant

## 2015-11-23 ENCOUNTER — Telehealth: Payer: Self-pay | Admitting: *Deleted

## 2015-11-23 VITALS — BP 160/62 | HR 64 | Ht 64.0 in | Wt 120.0 lb

## 2015-11-23 DIAGNOSIS — I2584 Coronary atherosclerosis due to calcified coronary lesion: Secondary | ICD-10-CM

## 2015-11-23 DIAGNOSIS — E7800 Pure hypercholesterolemia, unspecified: Secondary | ICD-10-CM | POA: Diagnosis not present

## 2015-11-23 DIAGNOSIS — I10 Essential (primary) hypertension: Secondary | ICD-10-CM

## 2015-11-23 DIAGNOSIS — I2510 Atherosclerotic heart disease of native coronary artery without angina pectoris: Secondary | ICD-10-CM

## 2015-11-23 LAB — BASIC METABOLIC PANEL
BUN: 17 mg/dL (ref 7–25)
CHLORIDE: 101 mmol/L (ref 98–110)
CO2: 25 mmol/L (ref 20–31)
Calcium: 9.3 mg/dL (ref 8.6–10.4)
Creat: 0.8 mg/dL (ref 0.60–0.93)
GLUCOSE: 80 mg/dL (ref 65–99)
POTASSIUM: 4.4 mmol/L (ref 3.5–5.3)
Sodium: 139 mmol/L (ref 135–146)

## 2015-11-23 LAB — MAGNESIUM: Magnesium: 2.1 mg/dL (ref 1.5–2.5)

## 2015-11-23 MED ORDER — TRIAMTERENE-HCTZ 37.5-25 MG PO TABS: 1.0000 | ORAL_TABLET | Freq: Every day | ORAL | 6 refills | Status: DC

## 2015-11-23 MED ORDER — ASPIRIN EC 81 MG PO TBEC: 81.0000 mg | DELAYED_RELEASE_TABLET | Freq: Every day | ORAL | Status: AC

## 2015-11-23 NOTE — Telephone Encounter (Signed)
DPR ok to leave detailed message on phone. Lmom labs looks ok. If any questions call back [phone].

## 2015-11-23 NOTE — Progress Notes (Signed)
Cardiology Office Note:    Date:  11/23/2015   ID:  Elizabeth Jenkins, DOB 02/16/1945, MRN [id number]  PCP:  Tivis Ringer, MD  Cardiologist:  Dr. Dorris Carnes   Electrophysiologist:  n/a  Referring MD: Prince Solian, MD   Chief Complaint  Patient presents with  . Follow-up    HTN    History of Present Illness:    Elizabeth Jenkins is a 70 y.o. female with a hx of elevated Ca score, HTN, HL.  LHC in 10/16 with minimal plaque in the LAD but no significant CAD.  She was last seen by Dr. Dorris Carnes in 1/17.     She notes severe leg cramps recently.  She stopped the Crestor.  But, she also ran out of Amlodipine.  Once she resumed the Amlodipine, she had recurrent leg cramps.  She is now off both medications without significant symptoms.  The patient denies chest pain, shortness of breath, syncope, orthopnea, PND or significant pedal edema.    Prior CV studies that were reviewed today include:    LHC 10/16 LAD prox 25 EF 55-65 No sig CAD  Echo 9/16 Inf and distal septal HK, EF 45-50, trivial AI, mild MR   Past Medical History:  Diagnosis Date  . Asthma   . Atrophic vaginitis   . Cervical intraepithelial neoplasia (CIN)   . Dermoid   . Osteoporosis   . Ovarian cyst     Past Surgical History:  Procedure Laterality Date  . ANKLE SURGERY    . APPENDECTOMY    . CARDIAC CATHETERIZATION N/A 11/11/2014   Procedure: Left Heart Cath and Coronary Angiography;  Surgeon: Jettie Booze, MD;  Location: [facility];  Service: Cardiovascular;  Laterality: N/A;  . COLPOSCOPY    . CONE BIOPSY OF CERVIX    . EXCISION OF BENIGN LABIAL LESION  2008  . RHINOPLASTY    . TUBAL LIGATION      Current Medications: Current Meds  Medication Sig  . albuterol (PROAIR HFA) 108 (90 BASE) MCG/ACT inhaler Inhale 2 puffs into the lungs every 6 (six) hours as needed for wheezing or shortness of breath.  . cetirizine (ZYRTEC) 10 MG tablet Take 5 mg by mouth daily.  . mometasone  (ASMANEX 120 METERED DOSES) 220 MCG/INH inhaler Inhale 2 puffs into the lungs as needed (inhale two puff into lungs as need for seasonal allergies).  . zolpidem (AMBIEN) 10 MG tablet Take 10 mg by mouth at bedtime as needed (only when traveling).     Allergies:   Lipitor [atorvastatin] and Quinine derivatives   Social History   Social History  . Marital status: Married    Spouse name: N/A  . Number of children: N/A  . Years of education: N/A   Occupational History  . Retired Unemployed   Social History Main Topics  . Smoking status: Former Smoker    Packs/day: 0.50    Years: 5.00    Types: Cigarettes    Quit date: 01/17/1970  . Smokeless tobacco: Never Used  . Alcohol use 2.5 oz/week    5 drink(s) per week     Comment: 5 nights/week--glass of wine  . Drug use: No  . Sexual activity: Yes    Birth control/ protection: Post-menopausal, Surgical   Other Topics Concern  . None   Social History Narrative  . None     Family History:  The patient's family history includes Diabetes in her father and mother; Heart disease in her father  and mother; Heart failure in her mother; Hypertension in her mother and sister; Stroke in her father.   ROS:   Please see the history of present illness.    ROS All other systems reviewed and are negative.   EKGs/Labs/Other Test Reviewed:    EKG:  EKG is  ordered today.  The ekg ordered today demonstrates NSR, HR 64, LAD, IVCD, QTc 431 ms, no change since last tracing.   Recent Labs: 10/13/2015: Hemoglobin 13.6; Platelets 187 11/23/2015: BUN 17; Creat 0.80; Magnesium 2.1; Potassium 4.4; Sodium 139   Recent Lipid Panel    Component Value Date/Time   CHOL 179 10/13/2015 0800   TRIG 56 10/13/2015 0800   HDL 83 10/13/2015 0800   CHOLHDL 2.2 10/13/2015 0800   VLDL 11 10/13/2015 0800   LDLCALC 85 10/13/2015 0800     Physical Exam:    VS:  BP (!) 160/62   Pulse 64   Ht 5\' 4"  (1.626 m)   Wt 120 lb (54.4 kg)   BMI 20.60 kg/m     Wt  Readings from Last 3 Encounters:  11/23/15 120 lb (54.4 kg)  02/16/15 120 lb (54.4 kg)  11/11/14 118 lb (53.5 kg)     Physical Exam  Constitutional: She is oriented to person, place, and time. She appears well-developed and well-nourished. No distress.  HENT:  Head: Normocephalic and atraumatic.  Eyes: No scleral icterus.  Neck: No JVD present.  Cardiovascular: Normal rate, regular rhythm and normal heart sounds.   No murmur heard. Pulmonary/Chest: Effort normal. She has no wheezes. She has no rales.  Abdominal: Soft. There is no tenderness.  Musculoskeletal: She exhibits no edema.  Neurological: She is alert and oriented to person, place, and time.  Skin: Skin is warm and dry.  Psychiatric: She has a normal mood and affect.    ASSESSMENT:    1. Essential hypertension   2. Pure hypercholesterolemia   3. Coronary artery calcification    PLAN:    In order of problems listed above:  1. HTN - She had significant leg cramps that seemed to be related to the Amlodipine.  She feels better off this medication. She was previously on Losartan and this was stopped due to side effects. We had a long discussion regarding options for blood pressure management.  She is not interested in trying Losartan again.  I do not think she would tolerate a beta-blocker as her HR is in the 60s. I have recommended Maxzide 37.5/25 mg QD.  BMET, Mg2+ today and repeat BMET in 1 week.  FU in 1 month.  She will log her BPs until then.  2. HL - She has had a hard time getting brand name Crestor thru her insurance.  She feels like Rosuvastatin causes more symptoms.  I have rec she remain off of statin Rx for now.  If doing well at FU, consider resuming Rosuvastatin, trial of Pravastatin or Zetia.    3. CAD - Ca score of 43 on CT in 2015.  Minimal plaque on [facility] in 2016. Continue ASA.  She is not having angina.   Medication Adjustments/Labs and Tests Ordered: Current medicines are reviewed at length with the  patient today.  Concerns regarding medicines are outlined above.  Medication changes, Labs and Tests ordered today are outlined in the Patient Instructions noted below. Patient Instructions  Medication Instructions:  STOP taking Amlodipine. DO NOT TAKE Rosuvastatin for now.  START Maxzide 37.5/25 MG 1 tablet daily; RX SENT IN  Labwork: Today - BMET, Magnesium  In 1 week - BMET  Testing/Procedures: None   Follow-Up: [facility] 12/28/15 @ 10:45 SAME DAY DR. Harrington Challenger IS IN THE OFFICE  Bring list of your blood pressures with you.  Any Other Special Instructions Will Be Listed Below (If Applicable). If you are tolerating the Maxzide ok after about 1-2 weeks, let us know and we can send your prescription to [facility].  If you need a refill on your cardiac medications before your next appointment, please call your pharmacy.   Signed, Richardson Dopp, PA-C  11/23/2015 5:10 PM    [facility] [address] [place], [place], [place]  [address] Phone: [phone]; Fax: [phone]

## 2015-11-23 NOTE — Patient Instructions (Addendum)
Medication Instructions:  STOP taking Amlodipine. DO NOT TAKE Rosuvastatin for now.  START Maxzide 37.5/25 MG 1 tablet daily; RX SENT IN  Labwork: Today - BMET, Magnesium  In 1 week - BMET  Testing/Procedures: None   Follow-Up: [facility], [facility] 12/28/15 @ 10:45 SAME DAY DR. Harrington Challenger IS IN THE OFFICE  Bring list of your blood pressures with you.  Any Other Special Instructions Will Be Listed Below (If Applicable). If you are tolerating the Maxzide ok after about 1-2 weeks, let us know and we can send your prescription to [facility].  If you need a refill on your cardiac medications before your next appointment, please call your pharmacy.

## 2015-11-25 ENCOUNTER — Ambulatory Visit (INDEPENDENT_AMBULATORY_CARE_PROVIDER_SITE_OTHER): Payer: Medicare Other | Admitting: Sports Medicine

## 2015-11-25 ENCOUNTER — Encounter: Payer: Self-pay | Admitting: Sports Medicine

## 2015-11-25 VITALS — Ht 64.0 in | Wt 118.0 lb

## 2015-11-25 DIAGNOSIS — M19171 Post-traumatic osteoarthritis, right ankle and foot: Secondary | ICD-10-CM

## 2015-11-25 DIAGNOSIS — I2584 Coronary atherosclerosis due to calcified coronary lesion: Secondary | ICD-10-CM | POA: Diagnosis not present

## 2015-11-25 DIAGNOSIS — I2510 Atherosclerotic heart disease of native coronary artery without angina pectoris: Secondary | ICD-10-CM

## 2015-11-25 NOTE — Progress Notes (Signed)
   Subjective:    Patient ID: Elizabeth Jenkins, female    DOB: [birth year], 70 y.o.   MRN: [id number]  HPI chief complaint: Right ankle pain  Very pleasant 70 year old female comes in today at the request of Dr. Noemi Chapel for custom orthotics. In review of Dr. 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 She recently had her subtalar joint injected under fluoroscopy and this has provided her with significant symptom relief. Dr. Noemi Chapel felt that custom orthotics may help provide additional support and prolonged pain relief. She is status post right ankle ligamentous reconstruction performed 22 years ago. She denies any recent trauma. No numbness or tingling.  Past medical history is reviewed Medications reviewed Allergies reviewed    Review of Systems    as above Objective:   Physical Exam  Well-developed, well-nourished. No acute distress. Awake alert and oriented 3. Vital signs reviewed  Examination of the patient's feet in the standing position shows a fairly well-preserved longitudinal arch with some collapse of the transverse arch. Mild hindfoot valgus but posterior tibialis tendon is intact and well functioning. No soft tissue swelling. Normal gait. Walking without a limp.   Right ankle demonstrates full active and passive range of motion. No effusion. Good stability.      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 She found these to be comfortable prior to leaving the office. She understands that she may follow-up as needed for adjustments. She will continue to follow-up with Dr. Noemi Chapel per his request. Follow-up with me as needed. Total time spent with the patient was 30 minutes with greater than 50% of the time spent in face-to-face consultation discussing orthotic construction, instruction, and  fitting.  Patient was fitted for a : standard, cushioned, semi-rigid orthotic. The orthotic was heated and afterward the patient stood on the orthotic blank positioned on the orthotic stand. The patient was positioned in subtalar neutral position and 10 degrees of ankle dorsiflexion in a weight bearing stance. After completion of molding, a stable base was applied to the orthotic blank. The blank was ground to a stable position for weight bearing. Size: 7 Base: Blue EVA Posting: none Additional orthotic padding: none

## 2015-11-26 ENCOUNTER — Telehealth: Payer: Self-pay | Admitting: Physician Assistant

## 2015-11-26 DIAGNOSIS — M7061 Trochanteric bursitis, right hip: Secondary | ICD-10-CM | POA: Diagnosis not present

## 2015-11-26 MED ORDER — IRBESARTAN 75 MG PO TABS: 75.0000 mg | ORAL_TABLET | Freq: Every day | ORAL | 3 refills | Status: DC

## 2015-11-26 NOTE — Telephone Encounter (Signed)
Note states she drank 1 glass of water yesterday.  If this is true, this may explain some of her leg cramps. I would recommend that she drink more water (at least 60 oz a day or more). Make sure she has FU BMET arranged for next week.  If she is already drinking plenty of water, see if she would be willing to try Avapro 75 mg QD. This medicine is an angiotensin receptor blocker which is the same class as Losartan. If she starts Avapro, she would need to stop Maxzide and still get BMET next week.  Richardson Dopp, PA-C   11/26/2015 2:05 PM

## 2015-11-26 NOTE — Telephone Encounter (Signed)
I spoke to patient and advised her of recommendations and medication options. She states she will NOT take Maxide anymore. She states she will start taking Avapro. I sent rx to [facility] at her request. I also advised her she still needed to increase H2O intake, even with Avapro. She voiced understanding.

## 2015-11-26 NOTE — Telephone Encounter (Signed)
I spoke to patient. She states she has taken Maxide 37.5-25mg  x2d. She states she was up every hour with leg cramping last night. She states she drank 1 glass of water yesterday and denies exercise on yesterday.  According to 11/6 BMET, K was 4.4.  S. Weaver's note from 11/6:  1. HTN - She had significant leg cramps that seemed to be related to the Amlodipine.  She feels better off this medication. She was previously on Losartan and this was stopped due to side effects. We had a long discussion regarding options for blood pressure management.  She is not interested in trying Losartan again.  I do not think she would tolerate a beta-blocker as her HR is in the 60s. I have recommended Maxzide 37.5/25 mg QD.  BMET, Mg2+ today and repeat BMET in 1 week.  FU in 1 month.  She will log her BPs until then.  Please advise.

## 2015-11-26 NOTE — Telephone Encounter (Signed)
New message       Pt c/o medication issue:  1. Name of Medication: maxzide 2. How are you currently taking this medication (dosage and times per day)?  37.5-25 3. Are you having a reaction (difficulty breathing--STAT)? no 4. What is your medication issue? Pt took medication for 2 days.  Last night she was up all night with leg cramps.  Please advise

## 2015-11-27 ENCOUNTER — Telehealth: Payer: Self-pay

## 2015-11-27 ENCOUNTER — Telehealth: Payer: Self-pay | Admitting: Internal Medicine

## 2015-11-27 NOTE — Telephone Encounter (Signed)
New Message  Pt voiced she is needing appt with MD-Ross, first avail on 01/01/16@ 8 am, Pt declined.  Offered Pa, which she is already scheduled for, pt voiced she is really needing to see MD-Ross.  Please f/u with pt

## 2015-11-27 NOTE — Telephone Encounter (Signed)
Prior auth for Irbesartan 75mg  approved by [facility]. [employer] notified.

## 2015-12-01 ENCOUNTER — Telehealth: Payer: Self-pay | Admitting: Physician Assistant

## 2015-12-01 ENCOUNTER — Other Ambulatory Visit: Payer: Medicare Other

## 2015-12-01 DIAGNOSIS — M7061 Trochanteric bursitis, right hip: Secondary | ICD-10-CM | POA: Diagnosis not present

## 2015-12-01 NOTE — Telephone Encounter (Signed)
I am aware that Avapro and Cozaar are in the same class and I asked for this to be communicated with the patient last week.  See phone note from 11/26/15 copied below: "Note states she drank 1 glass of water yesterday.  If this is true, this may explain some of her leg cramps. I would recommend that she drink more water (at least 60 oz a day or more). Make sure she has FU BMET arranged for next week. If she is already drinking plenty of water, see if she would be willing to try Avapro 75 mg QD. This medicine is an angiotensin receptor blocker which is the same class as Losartan. If she starts Avapro, she would need to stop Maxzide and still get BMET next week. Richardson Dopp, PA-C   11/26/2015 2:05 PM"   There are few options left to manage her BP. It would be worth trying a different medication in this class to see if she can tolerate it as she did not have a true allergy.  Let me know if she is not able to try this medication. We may need to send her to the HTN clinic to get their input as there are few medications left that we can try for HTN management. Richardson Dopp, PA-C   12/01/2015 5:34 PM

## 2015-12-01 NOTE — Telephone Encounter (Signed)
Pt called in regards to her medications, Avapro in reference to Losartan. See phone note from today. I will forward phone note to [place] for further recommendations.

## 2015-12-01 NOTE — Telephone Encounter (Signed)
New message   Pt verbalized that Elizabeth Jenkins prescribed her the avapro (1 ot 2 years ago) did you realize that she has originally taken losartin she believes this is in the same family of drugs  Please call

## 2015-12-02 NOTE — Telephone Encounter (Signed)
Pt also states she has asked to s/w the dr over and over and never gets to. I advised pt I do not know if she asked the nurse last week who s/w to have a dr call her however; I work with Nicki Reaper W. PA and I will ask for him to call you. I did advise pt that PA is not in the office today or tomorrow however will be in on Friday. Pt said that sure would be nice.

## 2015-12-02 NOTE — Telephone Encounter (Signed)
I s/w pt in regards to her call yesterday. I forwarded call yesterday to [place] for further recommendations. I went over advice that was given to the pt on 11/26/15 per Brynda Rim. PA.  I advised pt as well per Brynda Rim. PA as of 11/14:  There are few options left to manager her BP. It would be worth trying a different medication in this class to see if she can tolerate it as she did not have a true allergy. We may need to send her to the HTN clinic to get their input as there are few medications left that we can try for HTN management.  When I went over the new recommendations she hesitated with her response, and finally did say "she does not know what she is going to do". I asked pt what medication is she taking right now for BP? Pt states she is not taking anything. I advised pt she should probably be on one of the medications prescribed to her since her BP at last OV was 160/62 on 11/23/15 when she saw Brynda Rim. PA. Pt states to me that was a fluke because she was mad after having to wait so long. Pt tells me to note this as well that she is frustrated with the medical system. I empathized with the pt that it can be frustrating to both the pt and the provider to try and find a medication that works for you. Pt again said she does not know what she is going to do and hung up on me.

## 2015-12-04 ENCOUNTER — Other Ambulatory Visit: Payer: Self-pay | Admitting: Physician Assistant

## 2015-12-04 DIAGNOSIS — I10 Essential (primary) hypertension: Secondary | ICD-10-CM

## 2015-12-04 NOTE — Telephone Encounter (Signed)
Called patient and discussed new medicine for blood pressure. She is tolerating it ok. I advised her to call in a week if her BP is running high and we can adjust the dose if needed. She will need a BMET drawn in 1 week.  She is out of town and will be back 11/27.  She can come in 11/28 for the BMET. Order for BMET is placed in the system.  Please make sure lab is scheduled for 11/28. Richardson Dopp, PA-C   12/04/2015 1:51 PM

## 2015-12-04 NOTE — Telephone Encounter (Signed)
Lab appointment scheduled for 11/28 and bmet order linked to appointment

## 2015-12-14 DIAGNOSIS — M79641 Pain in right hand: Secondary | ICD-10-CM | POA: Diagnosis not present

## 2015-12-14 DIAGNOSIS — M1811 Unilateral primary osteoarthritis of first carpometacarpal joint, right hand: Secondary | ICD-10-CM | POA: Diagnosis not present

## 2015-12-15 ENCOUNTER — Other Ambulatory Visit: Payer: Medicare Other

## 2015-12-16 ENCOUNTER — Other Ambulatory Visit: Payer: Medicare Other | Admitting: *Deleted

## 2015-12-16 DIAGNOSIS — I10 Essential (primary) hypertension: Secondary | ICD-10-CM | POA: Diagnosis not present

## 2015-12-16 DIAGNOSIS — M7061 Trochanteric bursitis, right hip: Secondary | ICD-10-CM | POA: Diagnosis not present

## 2015-12-16 LAB — BASIC METABOLIC PANEL
BUN: 15 mg/dL (ref 7–25)
CHLORIDE: 105 mmol/L (ref 98–110)
CO2: 28 mmol/L (ref 20–31)
Calcium: 8.6 mg/dL (ref 8.6–10.4)
Creat: 0.85 mg/dL (ref 0.60–0.93)
GLUCOSE: 89 mg/dL (ref 65–99)
POTASSIUM: 4 mmol/L (ref 3.5–5.3)
SODIUM: 140 mmol/L (ref 135–146)

## 2015-12-17 NOTE — Telephone Encounter (Signed)
Offered patient an appointment on 12/15 that just became available with Dr. Harrington Challenger. She will be out of town that day and will keep her 12/11 appt with APP.  She thanked me for the call.

## 2015-12-18 ENCOUNTER — Telehealth: Payer: Self-pay | Admitting: Internal Medicine

## 2015-12-18 MED ORDER — AMLODIPINE BESYLATE 5 MG PO TABS: 5.0000 mg | ORAL_TABLET | Freq: Every day | ORAL | 3 refills | Status: AC

## 2015-12-18 NOTE — Telephone Encounter (Signed)
Ok DC Avapro  Start Amlodipine 5 mg QD Richardson Dopp, PA-C   12/18/2015 11:42 AM

## 2015-12-18 NOTE — Telephone Encounter (Signed)
Pt aware ok per Brynda Rim. PA ok to stop Avapro and start Amlodipine 5 mg daily. I advised pt if BP after about 2 weeks is still not optimum to please call the office, pt agreeable to this plan of care.

## 2015-12-18 NOTE — Telephone Encounter (Signed)
I will route to [place] for further recommendations. Pt is requesting to go back on Amlodipine.

## 2015-12-18 NOTE — Addendum Note (Signed)
Addended by: Michae Kava on: 12/18/2015 11:57 AM   Modules accepted: Orders

## 2015-12-18 NOTE — Telephone Encounter (Signed)
New message      Pt c/o BP issue: STAT if pt c/o blurred vision, one-sided weakness or slurred speech  1. What are your last 5 BP readings? 175/105  2. Are you having any other symptoms (ex. Dizziness, headache, blurred vision, passed out)? no 3. What is your BP issue?  Pt want to go back on the amlodipine. Will this be ok?

## 2015-12-22 DIAGNOSIS — M7061 Trochanteric bursitis, right hip: Secondary | ICD-10-CM | POA: Diagnosis not present

## 2015-12-23 ENCOUNTER — Ambulatory Visit: Payer: Medicare Other | Admitting: Physician Assistant

## 2015-12-23 DIAGNOSIS — I2510 Atherosclerotic heart disease of native coronary artery without angina pectoris: Secondary | ICD-10-CM | POA: Diagnosis not present

## 2015-12-23 DIAGNOSIS — I10 Essential (primary) hypertension: Secondary | ICD-10-CM | POA: Diagnosis not present

## 2015-12-23 DIAGNOSIS — Z6820 Body mass index (BMI) 20.0-20.9, adult: Secondary | ICD-10-CM | POA: Diagnosis not present

## 2015-12-23 DIAGNOSIS — E784 Other hyperlipidemia: Secondary | ICD-10-CM | POA: Diagnosis not present

## 2015-12-23 DIAGNOSIS — J449 Chronic obstructive pulmonary disease, unspecified: Secondary | ICD-10-CM | POA: Diagnosis not present

## 2015-12-24 DIAGNOSIS — M7061 Trochanteric bursitis, right hip: Secondary | ICD-10-CM | POA: Diagnosis not present

## 2015-12-28 ENCOUNTER — Ambulatory Visit: Payer: Medicare Other | Admitting: Physician Assistant

## 2015-12-28 DIAGNOSIS — M7061 Trochanteric bursitis, right hip: Secondary | ICD-10-CM | POA: Diagnosis not present

## 2016-01-05 DIAGNOSIS — M7061 Trochanteric bursitis, right hip: Secondary | ICD-10-CM | POA: Diagnosis not present

## 2016-01-06 DIAGNOSIS — M7061 Trochanteric bursitis, right hip: Secondary | ICD-10-CM | POA: Diagnosis not present

## 2016-01-06 DIAGNOSIS — M25551 Pain in right hip: Secondary | ICD-10-CM | POA: Diagnosis not present

## 2016-01-21 DIAGNOSIS — M7061 Trochanteric bursitis, right hip: Secondary | ICD-10-CM | POA: Diagnosis not present

## 2016-01-21 DIAGNOSIS — I10 Essential (primary) hypertension: Secondary | ICD-10-CM | POA: Diagnosis not present

## 2016-01-22 DIAGNOSIS — I10 Essential (primary) hypertension: Secondary | ICD-10-CM | POA: Diagnosis not present

## 2016-01-22 DIAGNOSIS — Z6820 Body mass index (BMI) 20.0-20.9, adult: Secondary | ICD-10-CM | POA: Diagnosis not present

## 2016-02-05 DIAGNOSIS — Z1211 Encounter for screening for malignant neoplasm of colon: Secondary | ICD-10-CM | POA: Diagnosis not present

## 2016-02-12 DIAGNOSIS — M7061 Trochanteric bursitis, right hip: Secondary | ICD-10-CM | POA: Diagnosis not present

## 2016-02-18 DIAGNOSIS — H35371 Puckering of macula, right eye: Secondary | ICD-10-CM | POA: Diagnosis not present

## 2016-02-24 DIAGNOSIS — M5416 Radiculopathy, lumbar region: Secondary | ICD-10-CM | POA: Diagnosis not present

## 2016-03-03 DIAGNOSIS — E784 Other hyperlipidemia: Secondary | ICD-10-CM | POA: Diagnosis not present

## 2016-03-03 DIAGNOSIS — Z0000 Encounter for general adult medical examination without abnormal findings: Secondary | ICD-10-CM | POA: Diagnosis not present

## 2016-03-03 DIAGNOSIS — I10 Essential (primary) hypertension: Secondary | ICD-10-CM | POA: Diagnosis not present

## 2016-03-03 DIAGNOSIS — E559 Vitamin D deficiency, unspecified: Secondary | ICD-10-CM | POA: Diagnosis not present

## 2016-03-09 DIAGNOSIS — M7061 Trochanteric bursitis, right hip: Secondary | ICD-10-CM | POA: Diagnosis not present

## 2016-03-09 DIAGNOSIS — M25551 Pain in right hip: Secondary | ICD-10-CM | POA: Diagnosis not present

## 2016-03-09 DIAGNOSIS — M5416 Radiculopathy, lumbar region: Secondary | ICD-10-CM | POA: Diagnosis not present

## 2016-03-11 DIAGNOSIS — K635 Polyp of colon: Secondary | ICD-10-CM | POA: Diagnosis not present

## 2016-03-11 DIAGNOSIS — Z1211 Encounter for screening for malignant neoplasm of colon: Secondary | ICD-10-CM | POA: Diagnosis not present

## 2016-03-11 DIAGNOSIS — K5730 Diverticulosis of large intestine without perforation or abscess without bleeding: Secondary | ICD-10-CM | POA: Diagnosis not present

## 2016-03-14 DIAGNOSIS — Z0000 Encounter for general adult medical examination without abnormal findings: Secondary | ICD-10-CM | POA: Diagnosis not present

## 2016-03-14 DIAGNOSIS — J302 Other seasonal allergic rhinitis: Secondary | ICD-10-CM | POA: Diagnosis not present

## 2016-03-14 DIAGNOSIS — J449 Chronic obstructive pulmonary disease, unspecified: Secondary | ICD-10-CM | POA: Diagnosis not present

## 2016-03-14 DIAGNOSIS — I10 Essential (primary) hypertension: Secondary | ICD-10-CM | POA: Diagnosis not present

## 2016-03-14 DIAGNOSIS — Z681 Body mass index (BMI) 19 or less, adult: Secondary | ICD-10-CM | POA: Diagnosis not present

## 2016-03-14 DIAGNOSIS — Z1389 Encounter for screening for other disorder: Secondary | ICD-10-CM | POA: Diagnosis not present

## 2016-03-14 DIAGNOSIS — Z23 Encounter for immunization: Secondary | ICD-10-CM | POA: Diagnosis not present

## 2016-03-14 DIAGNOSIS — M810 Age-related osteoporosis without current pathological fracture: Secondary | ICD-10-CM | POA: Diagnosis not present

## 2016-03-14 DIAGNOSIS — E784 Other hyperlipidemia: Secondary | ICD-10-CM | POA: Diagnosis not present

## 2016-03-14 DIAGNOSIS — K5730 Diverticulosis of large intestine without perforation or abscess without bleeding: Secondary | ICD-10-CM | POA: Diagnosis not present

## 2016-03-14 DIAGNOSIS — L659 Nonscarring hair loss, unspecified: Secondary | ICD-10-CM | POA: Diagnosis not present

## 2016-03-14 DIAGNOSIS — I2510 Atherosclerotic heart disease of native coronary artery without angina pectoris: Secondary | ICD-10-CM | POA: Diagnosis not present

## 2016-03-15 DIAGNOSIS — K635 Polyp of colon: Secondary | ICD-10-CM | POA: Diagnosis not present

## 2016-03-15 DIAGNOSIS — Z1211 Encounter for screening for malignant neoplasm of colon: Secondary | ICD-10-CM | POA: Diagnosis not present

## 2016-03-23 DIAGNOSIS — M5416 Radiculopathy, lumbar region: Secondary | ICD-10-CM | POA: Diagnosis not present

## 2016-05-18 DIAGNOSIS — M25571 Pain in right ankle and joints of right foot: Secondary | ICD-10-CM | POA: Diagnosis not present

## 2016-05-19 DIAGNOSIS — Z1389 Encounter for screening for other disorder: Secondary | ICD-10-CM | POA: Diagnosis not present

## 2016-05-19 DIAGNOSIS — I10 Essential (primary) hypertension: Secondary | ICD-10-CM | POA: Diagnosis not present

## 2016-05-19 DIAGNOSIS — E784 Other hyperlipidemia: Secondary | ICD-10-CM | POA: Diagnosis not present

## 2016-05-19 DIAGNOSIS — I2510 Atherosclerotic heart disease of native coronary artery without angina pectoris: Secondary | ICD-10-CM | POA: Diagnosis not present

## 2016-05-19 DIAGNOSIS — J302 Other seasonal allergic rhinitis: Secondary | ICD-10-CM | POA: Diagnosis not present

## 2016-05-19 DIAGNOSIS — Z6820 Body mass index (BMI) 20.0-20.9, adult: Secondary | ICD-10-CM | POA: Diagnosis not present

## 2016-06-16 ENCOUNTER — Other Ambulatory Visit: Payer: Self-pay | Admitting: Internal Medicine

## 2016-06-16 DIAGNOSIS — Z1231 Encounter for screening mammogram for malignant neoplasm of breast: Secondary | ICD-10-CM

## 2016-06-29 ENCOUNTER — Ambulatory Visit
Admission: RE | Admit: 2016-06-29 | Discharge: 2016-06-29 | Disposition: A | Discharge status: Home / Self Care | Payer: Medicare Other | Source: Ambulatory Visit | Attending: Internal Medicine | Admitting: Internal Medicine

## 2016-06-29 DIAGNOSIS — Z1231 Encounter for screening mammogram for malignant neoplasm of breast: Secondary | ICD-10-CM

## 2016-06-30 DIAGNOSIS — Z124 Encounter for screening for malignant neoplasm of cervix: Secondary | ICD-10-CM | POA: Diagnosis not present

## 2016-09-22 DIAGNOSIS — L720 Epidermal cyst: Secondary | ICD-10-CM | POA: Diagnosis not present

## 2016-09-22 DIAGNOSIS — L659 Nonscarring hair loss, unspecified: Secondary | ICD-10-CM | POA: Diagnosis not present

## 2016-09-29 DIAGNOSIS — E784 Other hyperlipidemia: Secondary | ICD-10-CM | POA: Diagnosis not present

## 2016-10-03 DIAGNOSIS — J302 Other seasonal allergic rhinitis: Secondary | ICD-10-CM | POA: Diagnosis not present

## 2016-10-03 DIAGNOSIS — E784 Other hyperlipidemia: Secondary | ICD-10-CM | POA: Diagnosis not present

## 2016-10-03 DIAGNOSIS — I10 Essential (primary) hypertension: Secondary | ICD-10-CM | POA: Diagnosis not present

## 2016-10-03 DIAGNOSIS — Z681 Body mass index (BMI) 19 or less, adult: Secondary | ICD-10-CM | POA: Diagnosis not present

## 2016-10-03 DIAGNOSIS — I2510 Atherosclerotic heart disease of native coronary artery without angina pectoris: Secondary | ICD-10-CM | POA: Diagnosis not present

## 2016-10-03 DIAGNOSIS — J449 Chronic obstructive pulmonary disease, unspecified: Secondary | ICD-10-CM | POA: Diagnosis not present

## 2016-10-03 DIAGNOSIS — Z23 Encounter for immunization: Secondary | ICD-10-CM | POA: Diagnosis not present

## 2016-11-16 DIAGNOSIS — J440 Chronic obstructive pulmonary disease with acute lower respiratory infection: Secondary | ICD-10-CM | POA: Diagnosis not present

## 2016-11-16 DIAGNOSIS — J302 Other seasonal allergic rhinitis: Secondary | ICD-10-CM | POA: Diagnosis not present

## 2016-11-16 DIAGNOSIS — R05 Cough: Secondary | ICD-10-CM | POA: Diagnosis not present

## 2016-11-16 DIAGNOSIS — J029 Acute pharyngitis, unspecified: Secondary | ICD-10-CM | POA: Diagnosis not present

## 2016-11-16 DIAGNOSIS — Z681 Body mass index (BMI) 19 or less, adult: Secondary | ICD-10-CM | POA: Diagnosis not present

## 2016-11-22 DIAGNOSIS — R05 Cough: Secondary | ICD-10-CM | POA: Diagnosis not present

## 2016-11-22 DIAGNOSIS — J449 Chronic obstructive pulmonary disease, unspecified: Secondary | ICD-10-CM | POA: Diagnosis not present

## 2016-11-22 DIAGNOSIS — J302 Other seasonal allergic rhinitis: Secondary | ICD-10-CM | POA: Diagnosis not present

## 2016-11-22 DIAGNOSIS — Z681 Body mass index (BMI) 19 or less, adult: Secondary | ICD-10-CM | POA: Diagnosis not present

## 2016-11-22 DIAGNOSIS — I10 Essential (primary) hypertension: Secondary | ICD-10-CM | POA: Diagnosis not present

## 2017-02-07 DIAGNOSIS — M1611 Unilateral primary osteoarthritis, right hip: Secondary | ICD-10-CM | POA: Diagnosis not present

## 2017-02-28 DIAGNOSIS — F411 Generalized anxiety disorder: Secondary | ICD-10-CM | POA: Diagnosis not present

## 2017-04-05 DIAGNOSIS — I10 Essential (primary) hypertension: Secondary | ICD-10-CM | POA: Diagnosis not present

## 2017-04-05 DIAGNOSIS — E559 Vitamin D deficiency, unspecified: Secondary | ICD-10-CM | POA: Diagnosis not present

## 2017-04-05 DIAGNOSIS — E7849 Other hyperlipidemia: Secondary | ICD-10-CM | POA: Diagnosis not present

## 2017-04-05 DIAGNOSIS — R82998 Other abnormal findings in urine: Secondary | ICD-10-CM | POA: Diagnosis not present

## 2017-04-10 DIAGNOSIS — H2513 Age-related nuclear cataract, bilateral: Secondary | ICD-10-CM | POA: Diagnosis not present

## 2017-04-12 DIAGNOSIS — K5730 Diverticulosis of large intestine without perforation or abscess without bleeding: Secondary | ICD-10-CM | POA: Diagnosis not present

## 2017-04-12 DIAGNOSIS — Z1389 Encounter for screening for other disorder: Secondary | ICD-10-CM | POA: Diagnosis not present

## 2017-04-12 DIAGNOSIS — J302 Other seasonal allergic rhinitis: Secondary | ICD-10-CM | POA: Diagnosis not present

## 2017-04-12 DIAGNOSIS — Z0000 Encounter for general adult medical examination without abnormal findings: Secondary | ICD-10-CM | POA: Diagnosis not present

## 2017-04-12 DIAGNOSIS — Z681 Body mass index (BMI) 19 or less, adult: Secondary | ICD-10-CM | POA: Diagnosis not present

## 2017-04-12 DIAGNOSIS — M810 Age-related osteoporosis without current pathological fracture: Secondary | ICD-10-CM | POA: Diagnosis not present

## 2017-04-12 DIAGNOSIS — I2510 Atherosclerotic heart disease of native coronary artery without angina pectoris: Secondary | ICD-10-CM | POA: Diagnosis not present

## 2017-04-12 DIAGNOSIS — J449 Chronic obstructive pulmonary disease, unspecified: Secondary | ICD-10-CM | POA: Diagnosis not present

## 2017-04-12 DIAGNOSIS — I10 Essential (primary) hypertension: Secondary | ICD-10-CM | POA: Diagnosis not present

## 2017-04-12 DIAGNOSIS — I444 Left anterior fascicular block: Secondary | ICD-10-CM | POA: Diagnosis not present

## 2017-04-12 DIAGNOSIS — E785 Hyperlipidemia, unspecified: Secondary | ICD-10-CM | POA: Diagnosis not present

## 2017-04-14 DIAGNOSIS — Z1212 Encounter for screening for malignant neoplasm of rectum: Secondary | ICD-10-CM | POA: Diagnosis not present

## 2017-04-25 DIAGNOSIS — M5136 Other intervertebral disc degeneration, lumbar region: Secondary | ICD-10-CM | POA: Diagnosis not present

## 2017-08-21 DIAGNOSIS — M5416 Radiculopathy, lumbar region: Secondary | ICD-10-CM | POA: Diagnosis not present

## 2017-09-21 DIAGNOSIS — L821 Other seborrheic keratosis: Secondary | ICD-10-CM | POA: Diagnosis not present

## 2017-09-21 DIAGNOSIS — L853 Xerosis cutis: Secondary | ICD-10-CM | POA: Diagnosis not present

## 2017-09-21 DIAGNOSIS — L720 Epidermal cyst: Secondary | ICD-10-CM | POA: Diagnosis not present

## 2017-10-05 DIAGNOSIS — M5416 Radiculopathy, lumbar region: Secondary | ICD-10-CM | POA: Diagnosis not present

## 2017-10-05 DIAGNOSIS — M5136 Other intervertebral disc degeneration, lumbar region: Secondary | ICD-10-CM | POA: Diagnosis not present

## 2017-10-05 DIAGNOSIS — M25551 Pain in right hip: Secondary | ICD-10-CM | POA: Diagnosis not present

## 2017-10-11 DIAGNOSIS — M545 Low back pain: Secondary | ICD-10-CM | POA: Diagnosis not present

## 2017-10-26 DIAGNOSIS — M545 Low back pain: Secondary | ICD-10-CM | POA: Diagnosis not present

## 2017-10-26 DIAGNOSIS — M47816 Spondylosis without myelopathy or radiculopathy, lumbar region: Secondary | ICD-10-CM | POA: Diagnosis not present

## 2017-10-26 DIAGNOSIS — M5136 Other intervertebral disc degeneration, lumbar region: Secondary | ICD-10-CM | POA: Diagnosis not present

## 2017-11-30 ENCOUNTER — Other Ambulatory Visit: Payer: Self-pay | Admitting: Internal Medicine

## 2017-11-30 DIAGNOSIS — M5136 Other intervertebral disc degeneration, lumbar region: Secondary | ICD-10-CM | POA: Diagnosis not present

## 2017-11-30 DIAGNOSIS — M47816 Spondylosis without myelopathy or radiculopathy, lumbar region: Secondary | ICD-10-CM | POA: Diagnosis not present

## 2017-11-30 DIAGNOSIS — M545 Low back pain: Secondary | ICD-10-CM | POA: Diagnosis not present

## 2017-11-30 DIAGNOSIS — Z1231 Encounter for screening mammogram for malignant neoplasm of breast: Secondary | ICD-10-CM

## 2017-12-19 DIAGNOSIS — H00031 Abscess of right upper eyelid: Secondary | ICD-10-CM | POA: Diagnosis not present

## 2017-12-19 DIAGNOSIS — D1801 Hemangioma of skin and subcutaneous tissue: Secondary | ICD-10-CM | POA: Diagnosis not present

## 2017-12-19 DIAGNOSIS — L821 Other seborrheic keratosis: Secondary | ICD-10-CM | POA: Diagnosis not present

## 2018-01-11 ENCOUNTER — Ambulatory Visit
Admission: RE | Admit: 2018-01-11 | Discharge: 2018-01-11 | Disposition: A | Discharge status: Home / Self Care | Payer: Medicare Other | Source: Ambulatory Visit | Attending: Internal Medicine | Admitting: Internal Medicine

## 2018-01-11 DIAGNOSIS — Z1231 Encounter for screening mammogram for malignant neoplasm of breast: Secondary | ICD-10-CM

## 2018-01-29 DIAGNOSIS — H0011 Chalazion right upper eyelid: Secondary | ICD-10-CM | POA: Diagnosis not present

## 2018-02-12 DIAGNOSIS — J3081 Allergic rhinitis due to animal (cat) (dog) hair and dander: Secondary | ICD-10-CM | POA: Diagnosis not present

## 2018-02-12 DIAGNOSIS — R062 Wheezing: Secondary | ICD-10-CM | POA: Diagnosis not present

## 2018-02-12 DIAGNOSIS — J3089 Other allergic rhinitis: Secondary | ICD-10-CM | POA: Diagnosis not present

## 2018-02-12 DIAGNOSIS — J301 Allergic rhinitis due to pollen: Secondary | ICD-10-CM | POA: Diagnosis not present

## 2018-03-19 DIAGNOSIS — J302 Other seasonal allergic rhinitis: Secondary | ICD-10-CM | POA: Diagnosis not present

## 2018-03-19 DIAGNOSIS — J449 Chronic obstructive pulmonary disease, unspecified: Secondary | ICD-10-CM | POA: Diagnosis not present

## 2018-03-19 DIAGNOSIS — H6691 Otitis media, unspecified, right ear: Secondary | ICD-10-CM | POA: Diagnosis not present

## 2018-03-19 DIAGNOSIS — J0100 Acute maxillary sinusitis, unspecified: Secondary | ICD-10-CM | POA: Diagnosis not present

## 2018-03-19 DIAGNOSIS — R05 Cough: Secondary | ICD-10-CM | POA: Diagnosis not present

## 2018-03-29 DIAGNOSIS — H2513 Age-related nuclear cataract, bilateral: Secondary | ICD-10-CM | POA: Diagnosis not present

## 2018-11-22 DIAGNOSIS — J3089 Other allergic rhinitis: Secondary | ICD-10-CM | POA: Diagnosis not present

## 2018-11-22 DIAGNOSIS — J301 Allergic rhinitis due to pollen: Secondary | ICD-10-CM | POA: Diagnosis not present

## 2018-11-22 DIAGNOSIS — J4530 Mild persistent asthma, uncomplicated: Secondary | ICD-10-CM | POA: Diagnosis not present

## 2018-11-22 DIAGNOSIS — J3081 Allergic rhinitis due to animal (cat) (dog) hair and dander: Secondary | ICD-10-CM | POA: Diagnosis not present

## 2019-01-25 DIAGNOSIS — M5416 Radiculopathy, lumbar region: Secondary | ICD-10-CM | POA: Diagnosis not present

## 2019-01-25 DIAGNOSIS — M545 Low back pain: Secondary | ICD-10-CM | POA: Diagnosis not present

## 2019-01-25 DIAGNOSIS — M5136 Other intervertebral disc degeneration, lumbar region: Secondary | ICD-10-CM | POA: Diagnosis not present

## 2019-01-28 ENCOUNTER — Ambulatory Visit: Payer: Medicare Other | Attending: Internal Medicine

## 2019-01-28 DIAGNOSIS — Z23 Encounter for immunization: Secondary | ICD-10-CM | POA: Diagnosis not present

## 2019-01-28 NOTE — Progress Notes (Signed)
   Covid-19 Vaccination Clinic  Name:  Elizabeth Jenkins    MRN: [id number] DOB: [birth year]  01/28/2019  Elizabeth Jenkins was observed post Covid-19 immunization for 30 minutes based on pre-vaccination screening without incidence. She was provided with Vaccine Information Sheet and instruction to access the V-Safe system.   Elizabeth Jenkins was instructed to call 911 with any severe reactions post vaccine: Marland Kitchen Difficulty breathing  . Swelling of your face and throat  . A fast heartbeat  . A bad rash all over your body  . Dizziness and weakness    Immunizations Administered    Name Date Dose VIS Date Route   Pfizer COVID-19 Vaccine 01/28/2019 10:22 AM 0.3 mL 12/28/2018 Intramuscular   Manufacturer: [place]   Lot: [id number]   [place]: [id number]

## 2019-02-07 DIAGNOSIS — M810 Age-related osteoporosis without current pathological fracture: Secondary | ICD-10-CM | POA: Diagnosis not present

## 2019-02-07 DIAGNOSIS — E7849 Other hyperlipidemia: Secondary | ICD-10-CM | POA: Diagnosis not present

## 2019-02-08 DIAGNOSIS — R82998 Other abnormal findings in urine: Secondary | ICD-10-CM | POA: Diagnosis not present

## 2019-02-08 DIAGNOSIS — I10 Essential (primary) hypertension: Secondary | ICD-10-CM | POA: Diagnosis not present

## 2019-02-11 DIAGNOSIS — M545 Low back pain: Secondary | ICD-10-CM | POA: Diagnosis not present

## 2019-02-11 DIAGNOSIS — M5416 Radiculopathy, lumbar region: Secondary | ICD-10-CM | POA: Diagnosis not present

## 2019-02-11 DIAGNOSIS — M7061 Trochanteric bursitis, right hip: Secondary | ICD-10-CM | POA: Diagnosis not present

## 2019-02-14 DIAGNOSIS — M810 Age-related osteoporosis without current pathological fracture: Secondary | ICD-10-CM | POA: Diagnosis not present

## 2019-02-14 DIAGNOSIS — I444 Left anterior fascicular block: Secondary | ICD-10-CM | POA: Diagnosis not present

## 2019-02-14 DIAGNOSIS — E785 Hyperlipidemia, unspecified: Secondary | ICD-10-CM | POA: Diagnosis not present

## 2019-02-14 DIAGNOSIS — Z0000 Encounter for general adult medical examination without abnormal findings: Secondary | ICD-10-CM | POA: Diagnosis not present

## 2019-02-14 DIAGNOSIS — I2510 Atherosclerotic heart disease of native coronary artery without angina pectoris: Secondary | ICD-10-CM | POA: Diagnosis not present

## 2019-02-14 DIAGNOSIS — I10 Essential (primary) hypertension: Secondary | ICD-10-CM | POA: Diagnosis not present

## 2019-02-14 DIAGNOSIS — M1990 Unspecified osteoarthritis, unspecified site: Secondary | ICD-10-CM | POA: Diagnosis not present

## 2019-02-14 DIAGNOSIS — J449 Chronic obstructive pulmonary disease, unspecified: Secondary | ICD-10-CM | POA: Diagnosis not present

## 2019-02-14 DIAGNOSIS — J302 Other seasonal allergic rhinitis: Secondary | ICD-10-CM | POA: Diagnosis not present

## 2019-02-14 DIAGNOSIS — Z1331 Encounter for screening for depression: Secondary | ICD-10-CM | POA: Diagnosis not present

## 2019-02-14 DIAGNOSIS — M5416 Radiculopathy, lumbar region: Secondary | ICD-10-CM | POA: Diagnosis not present

## 2019-02-14 DIAGNOSIS — K5730 Diverticulosis of large intestine without perforation or abscess without bleeding: Secondary | ICD-10-CM | POA: Diagnosis not present

## 2019-02-17 ENCOUNTER — Ambulatory Visit: Payer: Medicare Other | Attending: Internal Medicine

## 2019-02-17 DIAGNOSIS — Z23 Encounter for immunization: Secondary | ICD-10-CM | POA: Insufficient documentation

## 2019-02-17 NOTE — Progress Notes (Signed)
   Covid-19 Vaccination Clinic  Name:  Elizabeth Jenkins    MRN: [id number] DOB: 25-Dec-1945  02/17/2019  [name] was observed post Covid-19 immunization for 30 minutes based on pre-vaccination screening without incidence. She was provided with Vaccine Information Sheet and instruction to access the V-Safe system.   [name] was instructed to call 911 with any severe reactions post vaccine: Marland Kitchen Difficulty breathing  . Swelling of your face and throat  . A fast heartbeat  . A bad rash all over your body  . Dizziness and weakness    Immunizations Administered    Name Date Dose VIS Date Route   Pfizer COVID-19 Vaccine 02/17/2019  1:04 PM 0.3 mL 12/28/2018 Intramuscular   Manufacturer: [place]   Lot: [id number]   [place]: [id number]

## 2019-02-26 ENCOUNTER — Other Ambulatory Visit: Payer: Self-pay | Admitting: Internal Medicine

## 2019-02-26 DIAGNOSIS — Z1231 Encounter for screening mammogram for malignant neoplasm of breast: Secondary | ICD-10-CM

## 2019-02-28 ENCOUNTER — Other Ambulatory Visit: Payer: Self-pay | Admitting: Internal Medicine

## 2019-02-28 DIAGNOSIS — R5381 Other malaise: Secondary | ICD-10-CM

## 2019-02-28 DIAGNOSIS — M810 Age-related osteoporosis without current pathological fracture: Secondary | ICD-10-CM

## 2019-03-12 ENCOUNTER — Ambulatory Visit: Payer: Medicare Other

## 2019-03-21 ENCOUNTER — Ambulatory Visit
Admission: RE | Admit: 2019-03-21 | Discharge: 2019-03-21 | Disposition: A | Discharge status: Home / Self Care | Payer: Medicare Other | Source: Ambulatory Visit | Attending: Internal Medicine | Admitting: Internal Medicine

## 2019-03-21 ENCOUNTER — Other Ambulatory Visit: Payer: Self-pay

## 2019-03-21 DIAGNOSIS — M810 Age-related osteoporosis without current pathological fracture: Secondary | ICD-10-CM

## 2019-03-21 DIAGNOSIS — Z780 Asymptomatic menopausal state: Secondary | ICD-10-CM | POA: Diagnosis not present

## 2019-03-28 ENCOUNTER — Other Ambulatory Visit: Payer: Medicare Other

## 2019-04-03 ENCOUNTER — Other Ambulatory Visit: Payer: Self-pay

## 2019-04-03 ENCOUNTER — Ambulatory Visit
Admission: RE | Admit: 2019-04-03 | Discharge: 2019-04-03 | Disposition: A | Discharge status: Home / Self Care | Payer: Medicare Other | Source: Ambulatory Visit | Attending: Internal Medicine | Admitting: Internal Medicine

## 2019-04-03 DIAGNOSIS — Z1231 Encounter for screening mammogram for malignant neoplasm of breast: Secondary | ICD-10-CM

## 2019-04-09 DIAGNOSIS — L821 Other seborrheic keratosis: Secondary | ICD-10-CM | POA: Diagnosis not present

## 2019-04-09 DIAGNOSIS — L918 Other hypertrophic disorders of the skin: Secondary | ICD-10-CM | POA: Diagnosis not present

## 2019-04-12 ENCOUNTER — Ambulatory Visit: Payer: Medicare Other

## 2019-05-09 DIAGNOSIS — H43811 Vitreous degeneration, right eye: Secondary | ICD-10-CM | POA: Diagnosis not present

## 2019-05-09 DIAGNOSIS — H25813 Combined forms of age-related cataract, bilateral: Secondary | ICD-10-CM | POA: Diagnosis not present

## 2019-05-09 DIAGNOSIS — H524 Presbyopia: Secondary | ICD-10-CM | POA: Diagnosis not present

## 2019-05-20 DIAGNOSIS — M7061 Trochanteric bursitis, right hip: Secondary | ICD-10-CM | POA: Diagnosis not present

## 2019-05-28 DIAGNOSIS — M62838 Other muscle spasm: Secondary | ICD-10-CM | POA: Diagnosis not present

## 2019-05-28 DIAGNOSIS — I10 Essential (primary) hypertension: Secondary | ICD-10-CM | POA: Diagnosis not present

## 2019-05-28 DIAGNOSIS — E871 Hypo-osmolality and hyponatremia: Secondary | ICD-10-CM | POA: Diagnosis not present

## 2019-09-11 DIAGNOSIS — M7061 Trochanteric bursitis, right hip: Secondary | ICD-10-CM | POA: Diagnosis not present

## 2019-09-12 DIAGNOSIS — Z23 Encounter for immunization: Secondary | ICD-10-CM | POA: Diagnosis not present

## 2019-09-20 DIAGNOSIS — I10 Essential (primary) hypertension: Secondary | ICD-10-CM | POA: Diagnosis not present

## 2019-09-20 DIAGNOSIS — R252 Cramp and spasm: Secondary | ICD-10-CM | POA: Diagnosis not present

## 2019-10-10 DIAGNOSIS — I10 Essential (primary) hypertension: Secondary | ICD-10-CM | POA: Diagnosis not present

## 2019-10-10 DIAGNOSIS — E785 Hyperlipidemia, unspecified: Secondary | ICD-10-CM | POA: Diagnosis not present

## 2019-10-10 DIAGNOSIS — I444 Left anterior fascicular block: Secondary | ICD-10-CM | POA: Diagnosis not present

## 2019-10-10 DIAGNOSIS — M810 Age-related osteoporosis without current pathological fracture: Secondary | ICD-10-CM | POA: Diagnosis not present

## 2019-10-10 DIAGNOSIS — Z23 Encounter for immunization: Secondary | ICD-10-CM | POA: Diagnosis not present

## 2019-10-10 DIAGNOSIS — M5416 Radiculopathy, lumbar region: Secondary | ICD-10-CM | POA: Diagnosis not present

## 2019-10-10 DIAGNOSIS — J449 Chronic obstructive pulmonary disease, unspecified: Secondary | ICD-10-CM | POA: Diagnosis not present

## 2019-10-10 DIAGNOSIS — I2510 Atherosclerotic heart disease of native coronary artery without angina pectoris: Secondary | ICD-10-CM | POA: Diagnosis not present

## 2019-10-10 DIAGNOSIS — K5730 Diverticulosis of large intestine without perforation or abscess without bleeding: Secondary | ICD-10-CM | POA: Diagnosis not present

## 2019-10-10 DIAGNOSIS — M1990 Unspecified osteoarthritis, unspecified site: Secondary | ICD-10-CM | POA: Diagnosis not present

## 2019-10-10 DIAGNOSIS — R252 Cramp and spasm: Secondary | ICD-10-CM | POA: Diagnosis not present

## 2019-10-24 DIAGNOSIS — J449 Chronic obstructive pulmonary disease, unspecified: Secondary | ICD-10-CM | POA: Diagnosis not present

## 2019-10-24 DIAGNOSIS — I10 Essential (primary) hypertension: Secondary | ICD-10-CM | POA: Diagnosis not present

## 2019-10-24 DIAGNOSIS — R252 Cramp and spasm: Secondary | ICD-10-CM | POA: Diagnosis not present

## 2019-11-18 DIAGNOSIS — L821 Other seborrheic keratosis: Secondary | ICD-10-CM | POA: Diagnosis not present

## 2019-11-18 DIAGNOSIS — L720 Epidermal cyst: Secondary | ICD-10-CM | POA: Diagnosis not present

## 2019-11-25 DIAGNOSIS — J4530 Mild persistent asthma, uncomplicated: Secondary | ICD-10-CM | POA: Diagnosis not present

## 2019-11-25 DIAGNOSIS — J301 Allergic rhinitis due to pollen: Secondary | ICD-10-CM | POA: Diagnosis not present

## 2019-11-25 DIAGNOSIS — J3081 Allergic rhinitis due to animal (cat) (dog) hair and dander: Secondary | ICD-10-CM | POA: Diagnosis not present

## 2019-11-25 DIAGNOSIS — J3089 Other allergic rhinitis: Secondary | ICD-10-CM | POA: Diagnosis not present

## 2020-01-16 DIAGNOSIS — M7061 Trochanteric bursitis, right hip: Secondary | ICD-10-CM | POA: Diagnosis not present

## 2020-02-12 DIAGNOSIS — M5416 Radiculopathy, lumbar region: Secondary | ICD-10-CM | POA: Diagnosis not present

## 2020-02-12 DIAGNOSIS — M545 Low back pain, unspecified: Secondary | ICD-10-CM | POA: Diagnosis not present

## 2020-02-13 DIAGNOSIS — M5416 Radiculopathy, lumbar region: Secondary | ICD-10-CM | POA: Diagnosis not present

## 2020-02-14 DIAGNOSIS — E559 Vitamin D deficiency, unspecified: Secondary | ICD-10-CM | POA: Diagnosis not present

## 2020-02-14 DIAGNOSIS — Z0000 Encounter for general adult medical examination without abnormal findings: Secondary | ICD-10-CM | POA: Diagnosis not present

## 2020-02-14 DIAGNOSIS — M810 Age-related osteoporosis without current pathological fracture: Secondary | ICD-10-CM | POA: Diagnosis not present

## 2020-02-14 DIAGNOSIS — I10 Essential (primary) hypertension: Secondary | ICD-10-CM | POA: Diagnosis not present

## 2020-02-14 DIAGNOSIS — E785 Hyperlipidemia, unspecified: Secondary | ICD-10-CM | POA: Diagnosis not present

## 2020-02-19 DIAGNOSIS — Z0000 Encounter for general adult medical examination without abnormal findings: Secondary | ICD-10-CM | POA: Diagnosis not present

## 2020-02-19 DIAGNOSIS — I10 Essential (primary) hypertension: Secondary | ICD-10-CM | POA: Diagnosis not present

## 2020-02-19 DIAGNOSIS — J449 Chronic obstructive pulmonary disease, unspecified: Secondary | ICD-10-CM | POA: Diagnosis not present

## 2020-02-19 DIAGNOSIS — Z1331 Encounter for screening for depression: Secondary | ICD-10-CM | POA: Diagnosis not present

## 2020-02-19 DIAGNOSIS — E785 Hyperlipidemia, unspecified: Secondary | ICD-10-CM | POA: Diagnosis not present

## 2020-02-19 DIAGNOSIS — Z1339 Encounter for screening examination for other mental health and behavioral disorders: Secondary | ICD-10-CM | POA: Diagnosis not present

## 2020-02-19 DIAGNOSIS — E0590 Thyrotoxicosis, unspecified without thyrotoxic crisis or storm: Secondary | ICD-10-CM | POA: Diagnosis not present

## 2020-02-19 DIAGNOSIS — M5416 Radiculopathy, lumbar region: Secondary | ICD-10-CM | POA: Diagnosis not present

## 2020-02-19 DIAGNOSIS — R82998 Other abnormal findings in urine: Secondary | ICD-10-CM | POA: Diagnosis not present

## 2020-02-19 DIAGNOSIS — M810 Age-related osteoporosis without current pathological fracture: Secondary | ICD-10-CM | POA: Diagnosis not present

## 2020-02-19 DIAGNOSIS — I2510 Atherosclerotic heart disease of native coronary artery without angina pectoris: Secondary | ICD-10-CM | POA: Diagnosis not present

## 2020-02-19 DIAGNOSIS — M1990 Unspecified osteoarthritis, unspecified site: Secondary | ICD-10-CM | POA: Diagnosis not present

## 2020-02-19 DIAGNOSIS — R7301 Impaired fasting glucose: Secondary | ICD-10-CM | POA: Diagnosis not present

## 2020-02-20 DIAGNOSIS — K921 Melena: Secondary | ICD-10-CM | POA: Diagnosis not present

## 2020-03-05 DIAGNOSIS — M5416 Radiculopathy, lumbar region: Secondary | ICD-10-CM | POA: Diagnosis not present

## 2020-03-12 DIAGNOSIS — M545 Low back pain, unspecified: Secondary | ICD-10-CM | POA: Diagnosis not present

## 2020-03-16 DIAGNOSIS — M48061 Spinal stenosis, lumbar region without neurogenic claudication: Secondary | ICD-10-CM | POA: Diagnosis not present

## 2020-03-16 DIAGNOSIS — M7061 Trochanteric bursitis, right hip: Secondary | ICD-10-CM | POA: Diagnosis not present

## 2020-03-16 DIAGNOSIS — M461 Sacroiliitis, not elsewhere classified: Secondary | ICD-10-CM | POA: Diagnosis not present

## 2020-03-26 DIAGNOSIS — K5900 Constipation, unspecified: Secondary | ICD-10-CM | POA: Diagnosis not present

## 2020-03-26 DIAGNOSIS — Z8719 Personal history of other diseases of the digestive system: Secondary | ICD-10-CM | POA: Diagnosis not present

## 2020-03-26 DIAGNOSIS — R195 Other fecal abnormalities: Secondary | ICD-10-CM | POA: Diagnosis not present

## 2020-03-30 DIAGNOSIS — D2239 Melanocytic nevi of other parts of face: Secondary | ICD-10-CM | POA: Diagnosis not present

## 2020-04-02 ENCOUNTER — Other Ambulatory Visit: Payer: Self-pay | Admitting: Internal Medicine

## 2020-04-02 DIAGNOSIS — Z01812 Encounter for preprocedural laboratory examination: Secondary | ICD-10-CM | POA: Diagnosis not present

## 2020-04-02 DIAGNOSIS — M461 Sacroiliitis, not elsewhere classified: Secondary | ICD-10-CM | POA: Diagnosis not present

## 2020-04-02 DIAGNOSIS — Z1231 Encounter for screening mammogram for malignant neoplasm of breast: Secondary | ICD-10-CM

## 2020-04-06 DIAGNOSIS — D123 Benign neoplasm of transverse colon: Secondary | ICD-10-CM | POA: Diagnosis not present

## 2020-04-06 DIAGNOSIS — K621 Rectal polyp: Secondary | ICD-10-CM | POA: Diagnosis not present

## 2020-04-06 DIAGNOSIS — R195 Other fecal abnormalities: Secondary | ICD-10-CM | POA: Diagnosis not present

## 2020-04-06 DIAGNOSIS — D122 Benign neoplasm of ascending colon: Secondary | ICD-10-CM | POA: Diagnosis not present

## 2020-04-06 DIAGNOSIS — D120 Benign neoplasm of cecum: Secondary | ICD-10-CM | POA: Diagnosis not present

## 2020-04-06 DIAGNOSIS — K635 Polyp of colon: Secondary | ICD-10-CM | POA: Diagnosis not present

## 2020-04-06 DIAGNOSIS — K5730 Diverticulosis of large intestine without perforation or abscess without bleeding: Secondary | ICD-10-CM | POA: Diagnosis not present

## 2020-04-08 DIAGNOSIS — K621 Rectal polyp: Secondary | ICD-10-CM | POA: Diagnosis not present

## 2020-04-08 DIAGNOSIS — D120 Benign neoplasm of cecum: Secondary | ICD-10-CM | POA: Diagnosis not present

## 2020-04-08 DIAGNOSIS — D122 Benign neoplasm of ascending colon: Secondary | ICD-10-CM | POA: Diagnosis not present

## 2020-04-08 DIAGNOSIS — K635 Polyp of colon: Secondary | ICD-10-CM | POA: Diagnosis not present

## 2020-04-08 DIAGNOSIS — D123 Benign neoplasm of transverse colon: Secondary | ICD-10-CM | POA: Diagnosis not present

## 2020-04-30 DIAGNOSIS — M461 Sacroiliitis, not elsewhere classified: Secondary | ICD-10-CM | POA: Diagnosis not present

## 2020-05-04 ENCOUNTER — Ambulatory Visit
Admission: RE | Admit: 2020-05-04 | Discharge: 2020-05-04 | Disposition: A | Discharge status: Home / Self Care | Payer: Medicare Other | Source: Ambulatory Visit | Attending: Internal Medicine | Admitting: Internal Medicine

## 2020-05-04 ENCOUNTER — Other Ambulatory Visit: Payer: Self-pay

## 2020-05-04 DIAGNOSIS — Z1231 Encounter for screening mammogram for malignant neoplasm of breast: Secondary | ICD-10-CM | POA: Diagnosis not present

## 2020-05-05 DIAGNOSIS — H25813 Combined forms of age-related cataract, bilateral: Secondary | ICD-10-CM | POA: Diagnosis not present

## 2020-05-05 DIAGNOSIS — H43811 Vitreous degeneration, right eye: Secondary | ICD-10-CM | POA: Diagnosis not present

## 2020-05-05 DIAGNOSIS — H524 Presbyopia: Secondary | ICD-10-CM | POA: Diagnosis not present

## 2020-05-26 ENCOUNTER — Ambulatory Visit: Payer: Medicare Other

## 2020-08-04 DIAGNOSIS — Z20822 Contact with and (suspected) exposure to covid-19: Secondary | ICD-10-CM | POA: Diagnosis not present

## 2020-09-02 DIAGNOSIS — M461 Sacroiliitis, not elsewhere classified: Secondary | ICD-10-CM | POA: Diagnosis not present

## 2020-09-23 DIAGNOSIS — M461 Sacroiliitis, not elsewhere classified: Secondary | ICD-10-CM | POA: Diagnosis not present

## 2020-10-15 ENCOUNTER — Ambulatory Visit (INDEPENDENT_AMBULATORY_CARE_PROVIDER_SITE_OTHER): Payer: Medicare Other | Admitting: Family Medicine

## 2020-10-15 VITALS — Ht 64.0 in | Wt 110.0 lb

## 2020-10-15 DIAGNOSIS — M19071 Primary osteoarthritis, right ankle and foot: Secondary | ICD-10-CM

## 2020-10-15 DIAGNOSIS — M461 Sacroiliitis, not elsewhere classified: Secondary | ICD-10-CM | POA: Diagnosis not present

## 2020-10-15 NOTE — Progress Notes (Signed)
PCP: Prince Solian, MD  Subjective:   HPI: Patient is a 75 y.o. female here for custom orthotics.  Patient has done well with her custom orthotics from 5 years ago and they're still in good shape. No pain currently, would just like a new pair. Initial pair provided for subtalar arthritis and has done really well with them.  Past Medical History:  Diagnosis Date   Asthma    Atrophic vaginitis    Cervical intraepithelial neoplasia (CIN)    Dermoid    Osteoporosis    Ovarian cyst     Current Outpatient Medications on File Prior to Visit  Medication Sig Dispense Refill   albuterol (PROAIR HFA) 108 (90 BASE) MCG/ACT inhaler Inhale 2 puffs into the lungs every 6 (six) hours as needed for wheezing or shortness of breath. 3 Inhaler 0   amLODipine (NORVASC) 5 MG tablet Take 1 tablet (5 mg total) by mouth daily. 90 tablet 3   aspirin EC 81 MG tablet Take 1 tablet (81 mg total) by mouth daily.     cetirizine (ZYRTEC) 10 MG tablet Take 5 mg by mouth daily.     mometasone (ASMANEX 120 METERED DOSES) 220 MCG/INH inhaler Inhale 2 puffs into the lungs as needed (inhale two puff into lungs as need for seasonal allergies).     zolpidem (AMBIEN) 10 MG tablet Take 10 mg by mouth at bedtime as needed (only when traveling).     No current facility-administered medications on file prior to visit.    Past Surgical History:  Procedure Laterality Date   ANKLE SURGERY     APPENDECTOMY     CARDIAC CATHETERIZATION N/A 11/11/2014   Procedure: Left Heart Cath and Coronary Angiography;  Surgeon: Jettie Booze, MD;  Location: [facility];  Service: Cardiovascular;  Laterality: N/A;   COLPOSCOPY     CONE BIOPSY OF CERVIX     EXCISION OF BENIGN LABIAL LESION  2008   RHINOPLASTY     TUBAL LIGATION      Allergies  Allergen Reactions   Lipitor [Atorvastatin] Other (See Comments)    Body Aches   Quinine Derivatives     rash    Ht 5\' 4"  (1.626 m)   Wt 110 lb (49.9 kg)   BMI 18.88  kg/m   [facility] 10/15/2020  Frequency of aerobic exercise (# of days/week) 6  Average time in minutes 50  Frequency of strengthening activities (# of days/week) 0    No flowsheet data found.      Objective:  Physical Exam:  Gen: NAD, comfortable in exam room  Bilateral feet/ankles: Transverse arch collapse.  Long arches well preserves.  No other gross deformity, swelling, ecchymoses FROM No TTP NV intact distally.  No gait abnormalities.   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  F/u prn. Patient was fitted for a : standard, cushioned, semi-rigid orthotic. The orthotic was heated and afterward the patient stood on the orthotic blank positioned on the orthotic stand. The patient was positioned in subtalar neutral position and 10 degrees of ankle dorsiflexion in a weight bearing stance. After completion of molding, a stable base was applied to the orthotic blank. The blank was ground to a stable position for weight bearing. Size: 7 Base: blue med density eva Posting: none Additional orthotic padding: none

## 2020-10-21 DIAGNOSIS — I10 Essential (primary) hypertension: Secondary | ICD-10-CM | POA: Diagnosis not present

## 2020-10-21 DIAGNOSIS — I2510 Atherosclerotic heart disease of native coronary artery without angina pectoris: Secondary | ICD-10-CM | POA: Diagnosis not present

## 2020-10-21 DIAGNOSIS — J449 Chronic obstructive pulmonary disease, unspecified: Secondary | ICD-10-CM | POA: Diagnosis not present

## 2020-10-21 DIAGNOSIS — R252 Cramp and spasm: Secondary | ICD-10-CM | POA: Diagnosis not present

## 2020-11-09 DIAGNOSIS — J449 Chronic obstructive pulmonary disease, unspecified: Secondary | ICD-10-CM | POA: Diagnosis not present

## 2020-11-09 DIAGNOSIS — I2510 Atherosclerotic heart disease of native coronary artery without angina pectoris: Secondary | ICD-10-CM | POA: Diagnosis not present

## 2020-11-09 DIAGNOSIS — R252 Cramp and spasm: Secondary | ICD-10-CM | POA: Diagnosis not present

## 2020-11-09 DIAGNOSIS — I10 Essential (primary) hypertension: Secondary | ICD-10-CM | POA: Diagnosis not present

## 2020-11-09 DIAGNOSIS — E0590 Thyrotoxicosis, unspecified without thyrotoxic crisis or storm: Secondary | ICD-10-CM | POA: Diagnosis not present

## 2020-11-23 DIAGNOSIS — J3089 Other allergic rhinitis: Secondary | ICD-10-CM | POA: Diagnosis not present

## 2020-11-23 DIAGNOSIS — J301 Allergic rhinitis due to pollen: Secondary | ICD-10-CM | POA: Diagnosis not present

## 2020-11-23 DIAGNOSIS — J3081 Allergic rhinitis due to animal (cat) (dog) hair and dander: Secondary | ICD-10-CM | POA: Diagnosis not present

## 2020-11-23 DIAGNOSIS — J4530 Mild persistent asthma, uncomplicated: Secondary | ICD-10-CM | POA: Diagnosis not present

## 2021-01-01 DIAGNOSIS — M7061 Trochanteric bursitis, right hip: Secondary | ICD-10-CM | POA: Diagnosis not present

## 2021-02-02 DIAGNOSIS — M48061 Spinal stenosis, lumbar region without neurogenic claudication: Secondary | ICD-10-CM | POA: Diagnosis not present

## 2021-02-02 DIAGNOSIS — M25551 Pain in right hip: Secondary | ICD-10-CM | POA: Diagnosis not present

## 2021-02-12 DIAGNOSIS — M25551 Pain in right hip: Secondary | ICD-10-CM | POA: Diagnosis not present

## 2021-03-04 DIAGNOSIS — E0590 Thyrotoxicosis, unspecified without thyrotoxic crisis or storm: Secondary | ICD-10-CM | POA: Diagnosis not present

## 2021-03-04 DIAGNOSIS — E559 Vitamin D deficiency, unspecified: Secondary | ICD-10-CM | POA: Diagnosis not present

## 2021-03-04 DIAGNOSIS — E785 Hyperlipidemia, unspecified: Secondary | ICD-10-CM | POA: Diagnosis not present

## 2021-03-08 DIAGNOSIS — M7061 Trochanteric bursitis, right hip: Secondary | ICD-10-CM | POA: Diagnosis not present

## 2021-03-08 DIAGNOSIS — M7071 Other bursitis of hip, right hip: Secondary | ICD-10-CM | POA: Diagnosis not present

## 2021-03-22 DIAGNOSIS — M1990 Unspecified osteoarthritis, unspecified site: Secondary | ICD-10-CM | POA: Diagnosis not present

## 2021-03-22 DIAGNOSIS — J449 Chronic obstructive pulmonary disease, unspecified: Secondary | ICD-10-CM | POA: Diagnosis not present

## 2021-03-22 DIAGNOSIS — I444 Left anterior fascicular block: Secondary | ICD-10-CM | POA: Diagnosis not present

## 2021-03-22 DIAGNOSIS — I10 Essential (primary) hypertension: Secondary | ICD-10-CM | POA: Diagnosis not present

## 2021-03-22 DIAGNOSIS — I2510 Atherosclerotic heart disease of native coronary artery without angina pectoris: Secondary | ICD-10-CM | POA: Diagnosis not present

## 2021-03-22 DIAGNOSIS — R252 Cramp and spasm: Secondary | ICD-10-CM | POA: Diagnosis not present

## 2021-03-22 DIAGNOSIS — E559 Vitamin D deficiency, unspecified: Secondary | ICD-10-CM | POA: Diagnosis not present

## 2021-03-22 DIAGNOSIS — M5416 Radiculopathy, lumbar region: Secondary | ICD-10-CM | POA: Diagnosis not present

## 2021-03-22 DIAGNOSIS — E785 Hyperlipidemia, unspecified: Secondary | ICD-10-CM | POA: Diagnosis not present

## 2021-03-22 DIAGNOSIS — Z0000 Encounter for general adult medical examination without abnormal findings: Secondary | ICD-10-CM | POA: Diagnosis not present

## 2021-03-22 DIAGNOSIS — M810 Age-related osteoporosis without current pathological fracture: Secondary | ICD-10-CM | POA: Diagnosis not present

## 2021-03-22 DIAGNOSIS — E0590 Thyrotoxicosis, unspecified without thyrotoxic crisis or storm: Secondary | ICD-10-CM | POA: Diagnosis not present

## 2021-03-23 DIAGNOSIS — Z1212 Encounter for screening for malignant neoplasm of rectum: Secondary | ICD-10-CM | POA: Diagnosis not present

## 2021-03-23 DIAGNOSIS — R82998 Other abnormal findings in urine: Secondary | ICD-10-CM | POA: Diagnosis not present

## 2021-03-24 DIAGNOSIS — M7061 Trochanteric bursitis, right hip: Secondary | ICD-10-CM | POA: Diagnosis not present

## 2021-03-29 DIAGNOSIS — M47816 Spondylosis without myelopathy or radiculopathy, lumbar region: Secondary | ICD-10-CM | POA: Diagnosis not present

## 2021-04-08 ENCOUNTER — Other Ambulatory Visit: Payer: Self-pay | Admitting: Internal Medicine

## 2021-04-08 DIAGNOSIS — Z1231 Encounter for screening mammogram for malignant neoplasm of breast: Secondary | ICD-10-CM

## 2021-04-08 DIAGNOSIS — M47816 Spondylosis without myelopathy or radiculopathy, lumbar region: Secondary | ICD-10-CM | POA: Diagnosis not present

## 2021-04-13 DIAGNOSIS — M47816 Spondylosis without myelopathy or radiculopathy, lumbar region: Secondary | ICD-10-CM | POA: Diagnosis not present

## 2021-04-15 DIAGNOSIS — M47816 Spondylosis without myelopathy or radiculopathy, lumbar region: Secondary | ICD-10-CM | POA: Diagnosis not present

## 2021-04-19 DIAGNOSIS — M47816 Spondylosis without myelopathy or radiculopathy, lumbar region: Secondary | ICD-10-CM | POA: Diagnosis not present

## 2021-04-29 DIAGNOSIS — M47816 Spondylosis without myelopathy or radiculopathy, lumbar region: Secondary | ICD-10-CM | POA: Diagnosis not present

## 2021-05-03 DIAGNOSIS — M47816 Spondylosis without myelopathy or radiculopathy, lumbar region: Secondary | ICD-10-CM | POA: Diagnosis not present

## 2021-05-04 DIAGNOSIS — M25559 Pain in unspecified hip: Secondary | ICD-10-CM | POA: Diagnosis not present

## 2021-05-06 ENCOUNTER — Ambulatory Visit
Admission: RE | Admit: 2021-05-06 | Discharge: 2021-05-06 | Disposition: A | Discharge status: Home / Self Care | Payer: Medicare Other | Source: Ambulatory Visit | Attending: Internal Medicine | Admitting: Internal Medicine

## 2021-05-06 DIAGNOSIS — H5203 Hypermetropia, bilateral: Secondary | ICD-10-CM | POA: Diagnosis not present

## 2021-05-06 DIAGNOSIS — Z1231 Encounter for screening mammogram for malignant neoplasm of breast: Secondary | ICD-10-CM | POA: Diagnosis not present

## 2021-05-06 DIAGNOSIS — H43811 Vitreous degeneration, right eye: Secondary | ICD-10-CM | POA: Diagnosis not present

## 2021-05-06 DIAGNOSIS — H25813 Combined forms of age-related cataract, bilateral: Secondary | ICD-10-CM | POA: Diagnosis not present

## 2021-05-07 DIAGNOSIS — M7071 Other bursitis of hip, right hip: Secondary | ICD-10-CM | POA: Diagnosis not present

## 2021-05-17 DIAGNOSIS — M7071 Other bursitis of hip, right hip: Secondary | ICD-10-CM | POA: Diagnosis not present

## 2021-05-18 DIAGNOSIS — E785 Hyperlipidemia, unspecified: Secondary | ICD-10-CM | POA: Diagnosis not present

## 2021-05-18 DIAGNOSIS — I10 Essential (primary) hypertension: Secondary | ICD-10-CM | POA: Diagnosis not present

## 2021-05-24 DIAGNOSIS — M533 Sacrococcygeal disorders, not elsewhere classified: Secondary | ICD-10-CM | POA: Diagnosis not present

## 2021-06-24 DIAGNOSIS — M533 Sacrococcygeal disorders, not elsewhere classified: Secondary | ICD-10-CM | POA: Diagnosis not present

## 2021-07-22 IMAGING — MG MM DIGITAL SCREENING BILAT W/ TOMO AND CAD
8 series · 9 of 24 positions shown · non-contrast
Comparison: Previous exam(s).

CLINICAL DATA: Screening.

EXAM:
DIGITAL SCREENING BILATERAL MAMMOGRAM WITH TOMOSYNTHESIS AND CAD
TECHNIQUE: Bilateral screening digital craniocaudal and mediolateral oblique
mammograms were obtained. Bilateral screening digital breast
tomosynthesis was performed. The images were evaluated with
computer-aided detection.

[L MLO synth-2D]
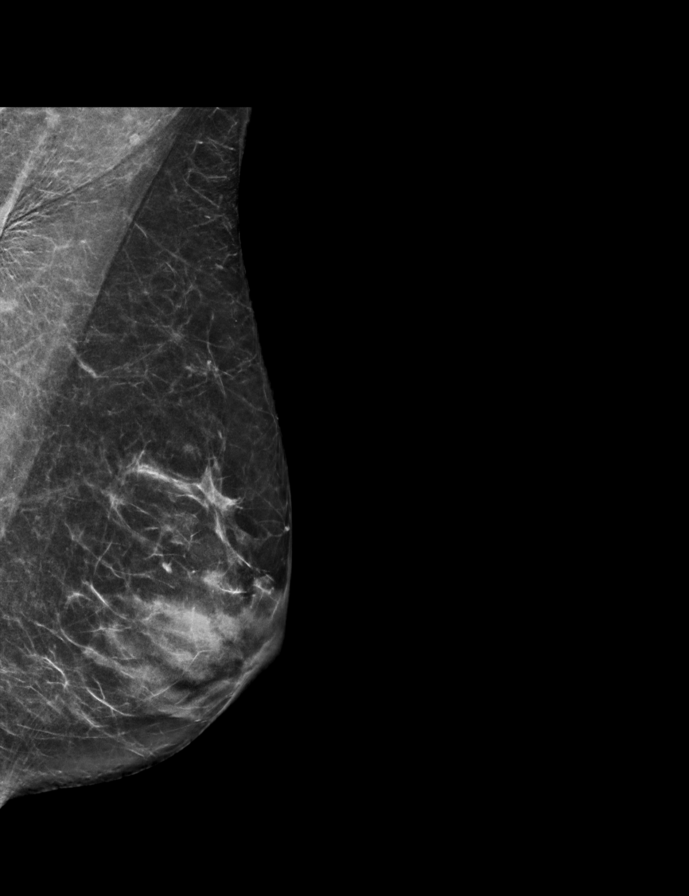

[R CC synth-2D]
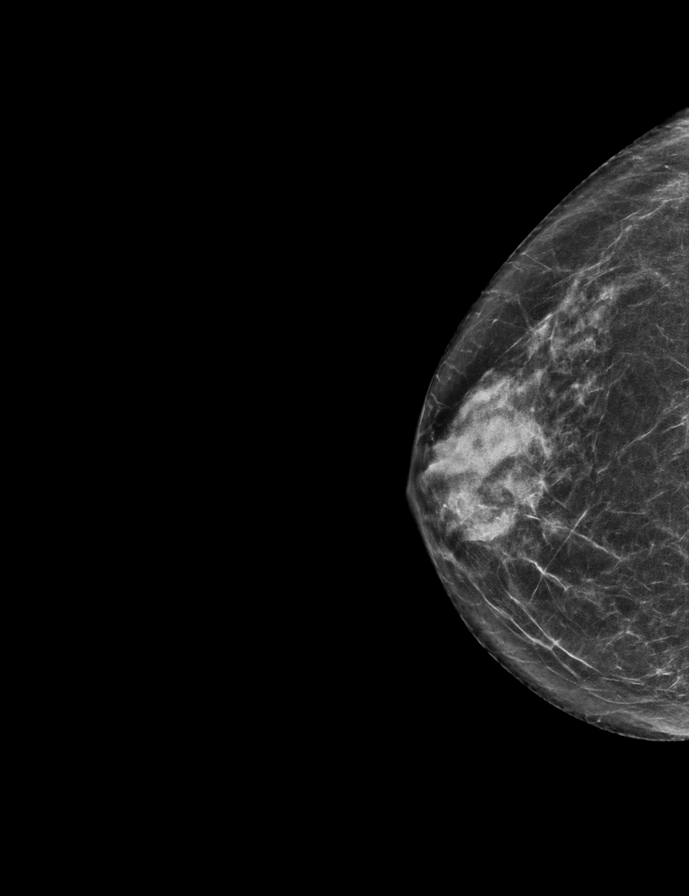

[L CC synth-2D]
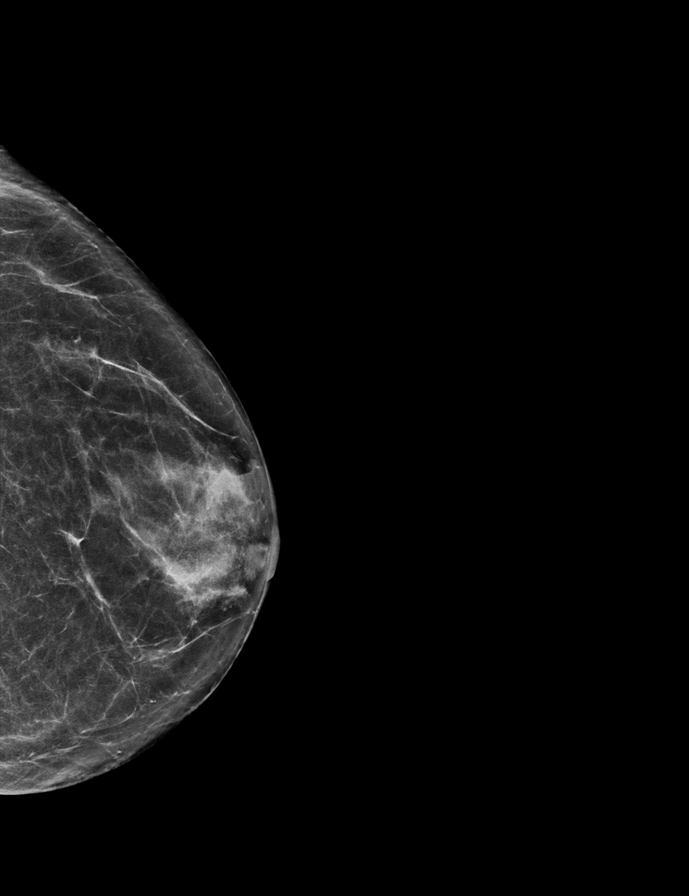

[R MLO synth-2D]
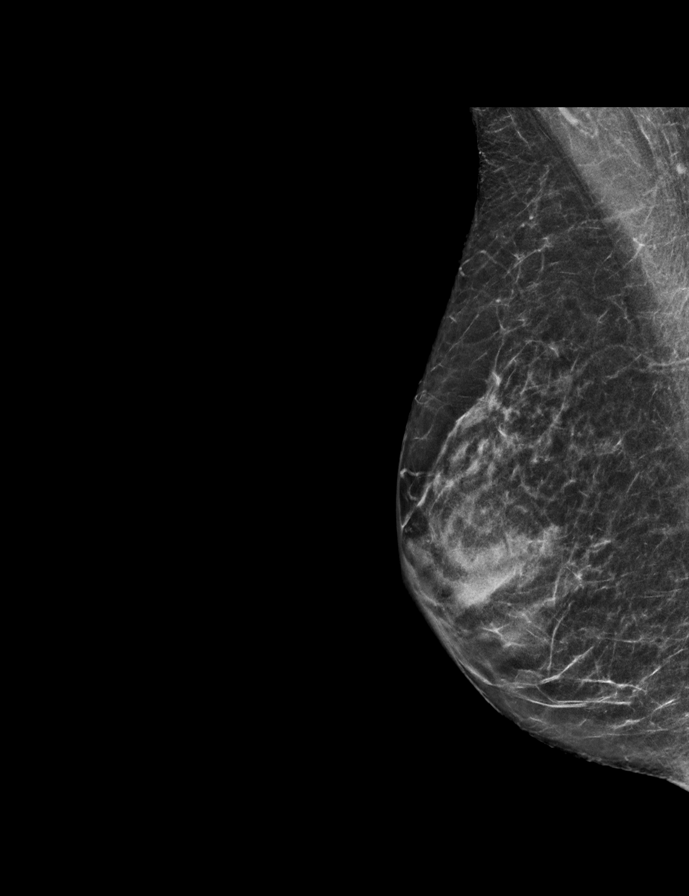

[R CC tomo · 2 of 58 frames shown]
[frame 19/58]
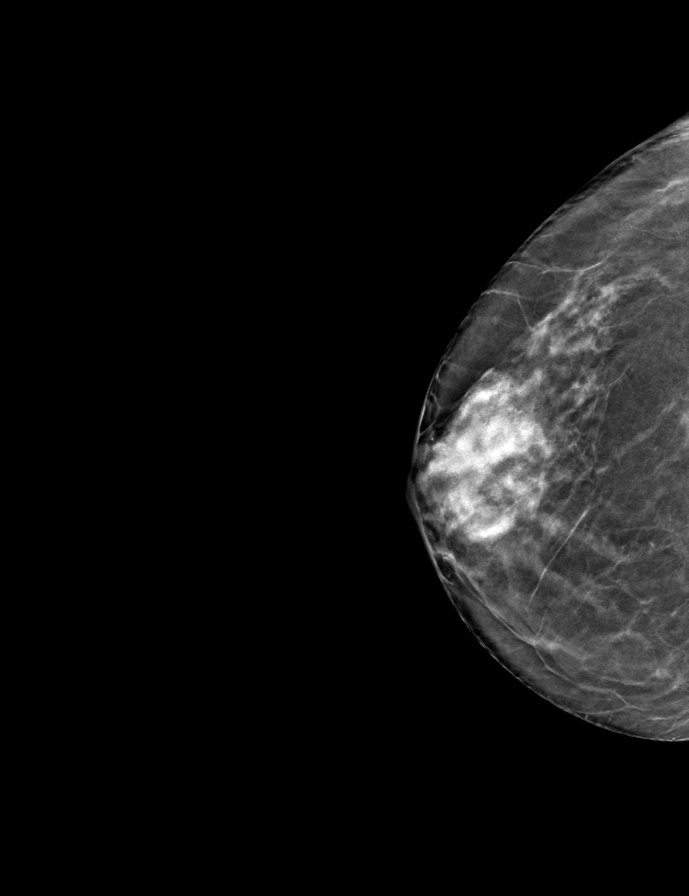
[frame 29/58]
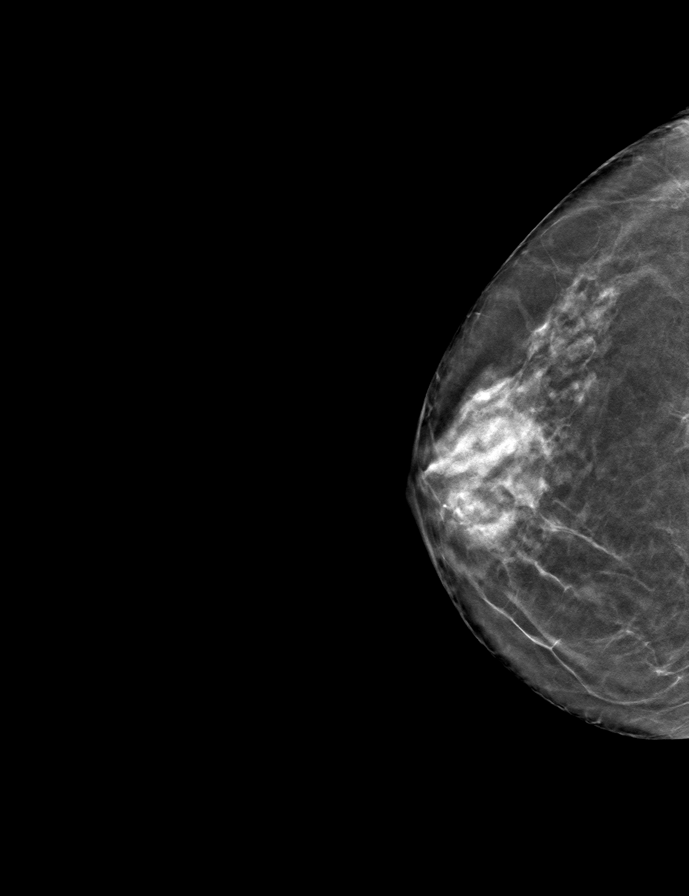

[R MLO tomo · tomo slice 27/54.0]
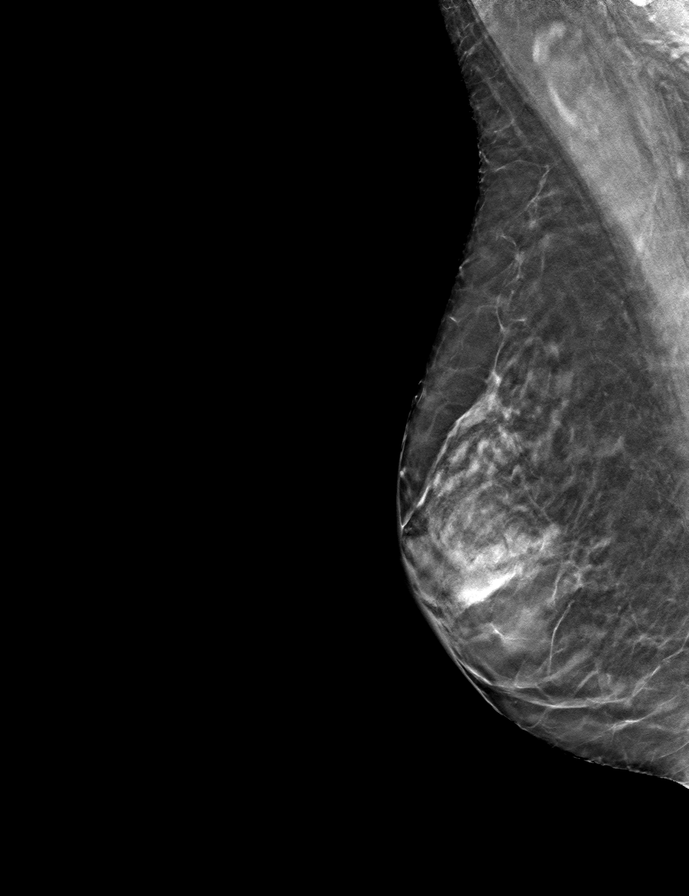

[L MLO tomo · tomo slice 31/60.0]
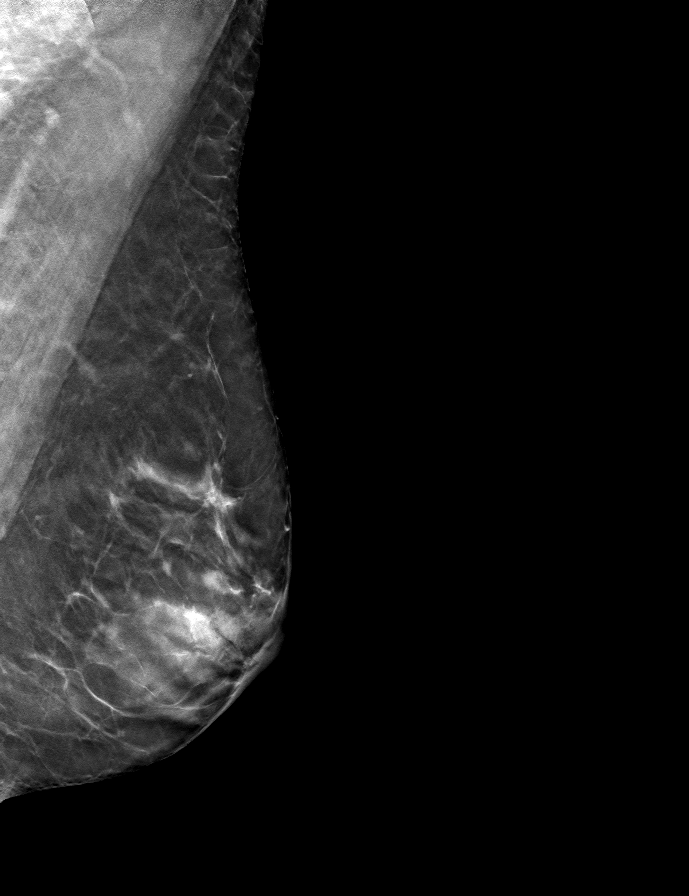

[L CC tomo · tomo slice 31/61.0]
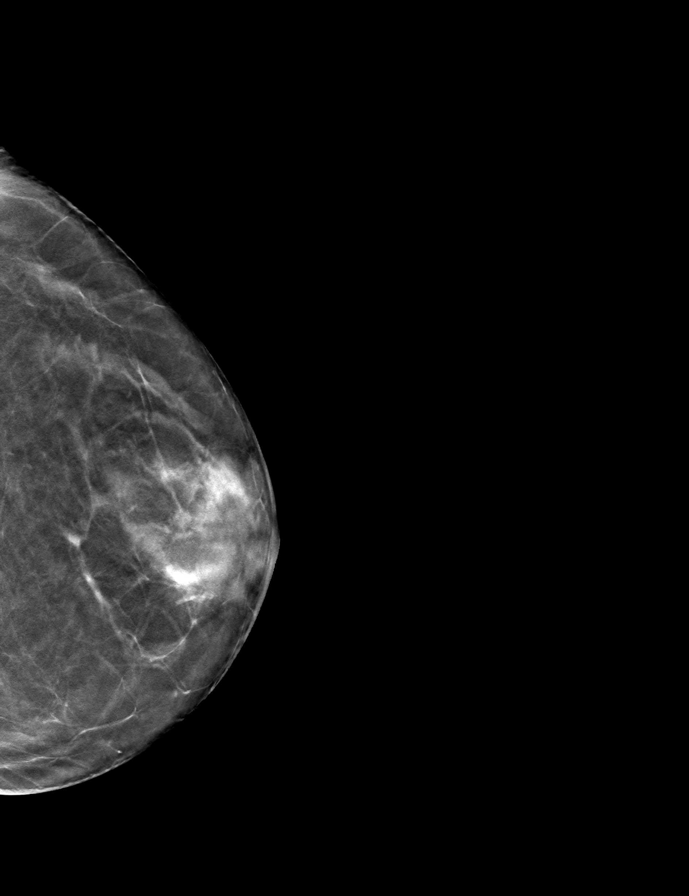

[9 of 24 positions shown; findings below may reference images not displayed]

ACR Breast Density Category b: There are scattered areas of
fibroglandular density.
FINDINGS: There are no findings suspicious for malignancy. The images were
evaluated with computer-aided detection.
IMPRESSION: No mammographic evidence of malignancy. A result letter of this
screening mammogram will be mailed directly to the patient.

RECOMMENDATION:
Screening mammogram in one year. (Code:WJ-I-BG6)

BI-RADS CATEGORY  1: Negative.

## 2021-09-06 DIAGNOSIS — G5761 Lesion of plantar nerve, right lower limb: Secondary | ICD-10-CM | POA: Diagnosis not present

## 2021-09-06 DIAGNOSIS — M79671 Pain in right foot: Secondary | ICD-10-CM | POA: Diagnosis not present

## 2021-09-06 DIAGNOSIS — M2012 Hallux valgus (acquired), left foot: Secondary | ICD-10-CM | POA: Diagnosis not present

## 2021-09-06 DIAGNOSIS — M2041 Other hammer toe(s) (acquired), right foot: Secondary | ICD-10-CM | POA: Diagnosis not present

## 2021-09-29 DIAGNOSIS — L309 Dermatitis, unspecified: Secondary | ICD-10-CM | POA: Diagnosis not present

## 2021-09-29 DIAGNOSIS — L918 Other hypertrophic disorders of the skin: Secondary | ICD-10-CM | POA: Diagnosis not present

## 2021-10-04 DIAGNOSIS — H2513 Age-related nuclear cataract, bilateral: Secondary | ICD-10-CM | POA: Diagnosis not present

## 2021-10-04 DIAGNOSIS — H02403 Unspecified ptosis of bilateral eyelids: Secondary | ICD-10-CM | POA: Diagnosis not present

## 2021-10-04 DIAGNOSIS — H35371 Puckering of macula, right eye: Secondary | ICD-10-CM | POA: Diagnosis not present

## 2021-10-04 DIAGNOSIS — H5201 Hypermetropia, right eye: Secondary | ICD-10-CM | POA: Diagnosis not present

## 2021-10-13 DIAGNOSIS — M533 Sacrococcygeal disorders, not elsewhere classified: Secondary | ICD-10-CM | POA: Diagnosis not present

## 2021-11-08 DIAGNOSIS — H2512 Age-related nuclear cataract, left eye: Secondary | ICD-10-CM | POA: Diagnosis not present

## 2021-11-08 DIAGNOSIS — H2511 Age-related nuclear cataract, right eye: Secondary | ICD-10-CM | POA: Diagnosis not present

## 2021-11-08 DIAGNOSIS — H52223 Regular astigmatism, bilateral: Secondary | ICD-10-CM | POA: Diagnosis not present

## 2021-11-11 DIAGNOSIS — M5416 Radiculopathy, lumbar region: Secondary | ICD-10-CM | POA: Diagnosis not present

## 2021-11-11 DIAGNOSIS — M7071 Other bursitis of hip, right hip: Secondary | ICD-10-CM | POA: Diagnosis not present

## 2021-11-11 DIAGNOSIS — M533 Sacrococcygeal disorders, not elsewhere classified: Secondary | ICD-10-CM | POA: Diagnosis not present

## 2021-11-22 DIAGNOSIS — J449 Chronic obstructive pulmonary disease, unspecified: Secondary | ICD-10-CM | POA: Diagnosis not present

## 2021-11-22 DIAGNOSIS — Z1152 Encounter for screening for COVID-19: Secondary | ICD-10-CM | POA: Diagnosis not present

## 2021-11-22 DIAGNOSIS — R5383 Other fatigue: Secondary | ICD-10-CM | POA: Diagnosis not present

## 2021-11-22 DIAGNOSIS — R0981 Nasal congestion: Secondary | ICD-10-CM | POA: Diagnosis not present

## 2021-11-22 DIAGNOSIS — R051 Acute cough: Secondary | ICD-10-CM | POA: Diagnosis not present

## 2021-11-22 DIAGNOSIS — J029 Acute pharyngitis, unspecified: Secondary | ICD-10-CM | POA: Diagnosis not present

## 2021-12-15 DIAGNOSIS — H269 Unspecified cataract: Secondary | ICD-10-CM | POA: Diagnosis not present

## 2021-12-15 DIAGNOSIS — H2511 Age-related nuclear cataract, right eye: Secondary | ICD-10-CM | POA: Diagnosis not present

## 2021-12-20 DIAGNOSIS — M7061 Trochanteric bursitis, right hip: Secondary | ICD-10-CM | POA: Diagnosis not present

## 2021-12-22 DIAGNOSIS — M7061 Trochanteric bursitis, right hip: Secondary | ICD-10-CM | POA: Diagnosis not present

## 2021-12-27 DIAGNOSIS — M7061 Trochanteric bursitis, right hip: Secondary | ICD-10-CM | POA: Diagnosis not present

## 2021-12-29 DIAGNOSIS — H269 Unspecified cataract: Secondary | ICD-10-CM | POA: Diagnosis not present

## 2021-12-29 DIAGNOSIS — H2512 Age-related nuclear cataract, left eye: Secondary | ICD-10-CM | POA: Diagnosis not present

## 2021-12-30 DIAGNOSIS — M7061 Trochanteric bursitis, right hip: Secondary | ICD-10-CM | POA: Diagnosis not present

## 2022-01-03 DIAGNOSIS — M7061 Trochanteric bursitis, right hip: Secondary | ICD-10-CM | POA: Diagnosis not present

## 2022-01-06 DIAGNOSIS — M7061 Trochanteric bursitis, right hip: Secondary | ICD-10-CM | POA: Diagnosis not present

## 2022-01-07 DIAGNOSIS — G5701 Lesion of sciatic nerve, right lower limb: Secondary | ICD-10-CM | POA: Diagnosis not present

## 2022-01-19 DIAGNOSIS — M7061 Trochanteric bursitis, right hip: Secondary | ICD-10-CM | POA: Diagnosis not present

## 2022-01-24 DIAGNOSIS — M7061 Trochanteric bursitis, right hip: Secondary | ICD-10-CM | POA: Diagnosis not present

## 2022-01-25 DIAGNOSIS — M7061 Trochanteric bursitis, right hip: Secondary | ICD-10-CM | POA: Diagnosis not present

## 2022-01-25 DIAGNOSIS — M533 Sacrococcygeal disorders, not elsewhere classified: Secondary | ICD-10-CM | POA: Diagnosis not present

## 2022-01-25 DIAGNOSIS — M7071 Other bursitis of hip, right hip: Secondary | ICD-10-CM | POA: Diagnosis not present

## 2022-01-27 DIAGNOSIS — M7061 Trochanteric bursitis, right hip: Secondary | ICD-10-CM | POA: Diagnosis not present

## 2022-01-31 DIAGNOSIS — M7061 Trochanteric bursitis, right hip: Secondary | ICD-10-CM | POA: Diagnosis not present

## 2022-02-03 DIAGNOSIS — M7061 Trochanteric bursitis, right hip: Secondary | ICD-10-CM | POA: Diagnosis not present

## 2022-02-07 DIAGNOSIS — M79674 Pain in right toe(s): Secondary | ICD-10-CM | POA: Diagnosis not present

## 2022-02-07 DIAGNOSIS — L03039 Cellulitis of unspecified toe: Secondary | ICD-10-CM | POA: Diagnosis not present

## 2022-02-07 DIAGNOSIS — M7061 Trochanteric bursitis, right hip: Secondary | ICD-10-CM | POA: Diagnosis not present

## 2022-02-10 DIAGNOSIS — M7061 Trochanteric bursitis, right hip: Secondary | ICD-10-CM | POA: Diagnosis not present

## 2022-02-11 DIAGNOSIS — L03039 Cellulitis of unspecified toe: Secondary | ICD-10-CM | POA: Diagnosis not present

## 2022-02-11 DIAGNOSIS — M79674 Pain in right toe(s): Secondary | ICD-10-CM | POA: Diagnosis not present

## 2022-02-14 ENCOUNTER — Ambulatory Visit: Payer: Medicare Other | Admitting: Podiatry

## 2022-02-14 DIAGNOSIS — M205X1 Other deformities of toe(s) (acquired), right foot: Secondary | ICD-10-CM | POA: Diagnosis not present

## 2022-02-14 DIAGNOSIS — L02611 Cutaneous abscess of right foot: Secondary | ICD-10-CM | POA: Diagnosis not present

## 2022-02-14 DIAGNOSIS — M205X2 Other deformities of toe(s) (acquired), left foot: Secondary | ICD-10-CM | POA: Diagnosis not present

## 2022-02-14 DIAGNOSIS — I70203 Unspecified atherosclerosis of native arteries of extremities, bilateral legs: Secondary | ICD-10-CM | POA: Diagnosis not present

## 2022-02-14 DIAGNOSIS — M7061 Trochanteric bursitis, right hip: Secondary | ICD-10-CM | POA: Diagnosis not present

## 2022-02-14 DIAGNOSIS — L600 Ingrowing nail: Secondary | ICD-10-CM | POA: Diagnosis not present

## 2022-02-17 ENCOUNTER — Ambulatory Visit: Payer: Self-pay | Admitting: Podiatry

## 2022-02-17 DIAGNOSIS — M7061 Trochanteric bursitis, right hip: Secondary | ICD-10-CM | POA: Diagnosis not present

## 2022-03-17 DIAGNOSIS — I70203 Unspecified atherosclerosis of native arteries of extremities, bilateral legs: Secondary | ICD-10-CM | POA: Diagnosis not present

## 2022-03-17 DIAGNOSIS — M205X2 Other deformities of toe(s) (acquired), left foot: Secondary | ICD-10-CM | POA: Diagnosis not present

## 2022-03-17 DIAGNOSIS — M205X1 Other deformities of toe(s) (acquired), right foot: Secondary | ICD-10-CM | POA: Diagnosis not present

## 2022-03-17 DIAGNOSIS — L02611 Cutaneous abscess of right foot: Secondary | ICD-10-CM | POA: Diagnosis not present

## 2022-03-17 DIAGNOSIS — L84 Corns and callosities: Secondary | ICD-10-CM | POA: Diagnosis not present

## 2022-03-17 DIAGNOSIS — L600 Ingrowing nail: Secondary | ICD-10-CM | POA: Diagnosis not present

## 2022-03-21 DIAGNOSIS — Z01411 Encounter for gynecological examination (general) (routine) with abnormal findings: Secondary | ICD-10-CM | POA: Diagnosis not present

## 2022-03-21 DIAGNOSIS — Z01419 Encounter for gynecological examination (general) (routine) without abnormal findings: Secondary | ICD-10-CM | POA: Diagnosis not present

## 2022-03-21 DIAGNOSIS — Z681 Body mass index (BMI) 19 or less, adult: Secondary | ICD-10-CM | POA: Diagnosis not present

## 2022-03-21 DIAGNOSIS — Z124 Encounter for screening for malignant neoplasm of cervix: Secondary | ICD-10-CM | POA: Diagnosis not present

## 2022-03-23 DIAGNOSIS — M7061 Trochanteric bursitis, right hip: Secondary | ICD-10-CM | POA: Diagnosis not present

## 2022-03-30 DIAGNOSIS — M7061 Trochanteric bursitis, right hip: Secondary | ICD-10-CM | POA: Diagnosis not present

## 2022-04-06 ENCOUNTER — Other Ambulatory Visit: Payer: Self-pay | Admitting: Internal Medicine

## 2022-04-06 DIAGNOSIS — Z1231 Encounter for screening mammogram for malignant neoplasm of breast: Secondary | ICD-10-CM

## 2022-04-06 DIAGNOSIS — M7061 Trochanteric bursitis, right hip: Secondary | ICD-10-CM | POA: Diagnosis not present

## 2022-04-12 DIAGNOSIS — E0590 Thyrotoxicosis, unspecified without thyrotoxic crisis or storm: Secondary | ICD-10-CM | POA: Diagnosis not present

## 2022-04-12 DIAGNOSIS — I10 Essential (primary) hypertension: Secondary | ICD-10-CM | POA: Diagnosis not present

## 2022-04-12 DIAGNOSIS — E559 Vitamin D deficiency, unspecified: Secondary | ICD-10-CM | POA: Diagnosis not present

## 2022-04-12 DIAGNOSIS — R7989 Other specified abnormal findings of blood chemistry: Secondary | ICD-10-CM | POA: Diagnosis not present

## 2022-04-12 DIAGNOSIS — E785 Hyperlipidemia, unspecified: Secondary | ICD-10-CM | POA: Diagnosis not present

## 2022-04-18 DIAGNOSIS — M7061 Trochanteric bursitis, right hip: Secondary | ICD-10-CM | POA: Diagnosis not present

## 2022-04-21 DIAGNOSIS — R82998 Other abnormal findings in urine: Secondary | ICD-10-CM | POA: Diagnosis not present

## 2022-04-22 DIAGNOSIS — K5730 Diverticulosis of large intestine without perforation or abscess without bleeding: Secondary | ICD-10-CM | POA: Diagnosis not present

## 2022-04-22 DIAGNOSIS — I10 Essential (primary) hypertension: Secondary | ICD-10-CM | POA: Diagnosis not present

## 2022-04-22 DIAGNOSIS — Z1331 Encounter for screening for depression: Secondary | ICD-10-CM | POA: Diagnosis not present

## 2022-04-22 DIAGNOSIS — M810 Age-related osteoporosis without current pathological fracture: Secondary | ICD-10-CM | POA: Diagnosis not present

## 2022-04-22 DIAGNOSIS — Z23 Encounter for immunization: Secondary | ICD-10-CM | POA: Diagnosis not present

## 2022-04-22 DIAGNOSIS — J449 Chronic obstructive pulmonary disease, unspecified: Secondary | ICD-10-CM | POA: Diagnosis not present

## 2022-04-22 DIAGNOSIS — Z0000 Encounter for general adult medical examination without abnormal findings: Secondary | ICD-10-CM | POA: Diagnosis not present

## 2022-04-22 DIAGNOSIS — M5416 Radiculopathy, lumbar region: Secondary | ICD-10-CM | POA: Diagnosis not present

## 2022-04-22 DIAGNOSIS — E785 Hyperlipidemia, unspecified: Secondary | ICD-10-CM | POA: Diagnosis not present

## 2022-04-22 DIAGNOSIS — E0590 Thyrotoxicosis, unspecified without thyrotoxic crisis or storm: Secondary | ICD-10-CM | POA: Diagnosis not present

## 2022-04-22 DIAGNOSIS — I2510 Atherosclerotic heart disease of native coronary artery without angina pectoris: Secondary | ICD-10-CM | POA: Diagnosis not present

## 2022-04-22 DIAGNOSIS — Z1339 Encounter for screening examination for other mental health and behavioral disorders: Secondary | ICD-10-CM | POA: Diagnosis not present

## 2022-04-25 DIAGNOSIS — M7061 Trochanteric bursitis, right hip: Secondary | ICD-10-CM | POA: Diagnosis not present

## 2022-05-03 DIAGNOSIS — H35373 Puckering of macula, bilateral: Secondary | ICD-10-CM | POA: Diagnosis not present

## 2022-05-04 DIAGNOSIS — M7061 Trochanteric bursitis, right hip: Secondary | ICD-10-CM | POA: Diagnosis not present

## 2022-05-09 DIAGNOSIS — M7061 Trochanteric bursitis, right hip: Secondary | ICD-10-CM | POA: Diagnosis not present

## 2022-05-11 DIAGNOSIS — M533 Sacrococcygeal disorders, not elsewhere classified: Secondary | ICD-10-CM | POA: Diagnosis not present

## 2022-05-23 ENCOUNTER — Ambulatory Visit
Admission: RE | Admit: 2022-05-23 | Discharge: 2022-05-23 | Disposition: A | Discharge status: Home / Self Care | Payer: Medicare Other | Source: Ambulatory Visit | Attending: Internal Medicine | Admitting: Internal Medicine

## 2022-05-23 DIAGNOSIS — Z1231 Encounter for screening mammogram for malignant neoplasm of breast: Secondary | ICD-10-CM | POA: Diagnosis not present

## 2022-06-07 DIAGNOSIS — M533 Sacrococcygeal disorders, not elsewhere classified: Secondary | ICD-10-CM | POA: Diagnosis not present

## 2022-10-03 DIAGNOSIS — M25561 Pain in right knee: Secondary | ICD-10-CM | POA: Diagnosis not present

## 2022-10-05 DIAGNOSIS — M533 Sacrococcygeal disorders, not elsewhere classified: Secondary | ICD-10-CM | POA: Diagnosis not present

## 2022-10-22 DIAGNOSIS — Z23 Encounter for immunization: Secondary | ICD-10-CM | POA: Diagnosis not present

## 2022-10-25 DIAGNOSIS — M533 Sacrococcygeal disorders, not elsewhere classified: Secondary | ICD-10-CM | POA: Diagnosis not present

## 2022-10-31 DIAGNOSIS — J209 Acute bronchitis, unspecified: Secondary | ICD-10-CM | POA: Diagnosis not present

## 2022-10-31 DIAGNOSIS — Z1152 Encounter for screening for COVID-19: Secondary | ICD-10-CM | POA: Diagnosis not present

## 2022-10-31 DIAGNOSIS — J449 Chronic obstructive pulmonary disease, unspecified: Secondary | ICD-10-CM | POA: Diagnosis not present

## 2022-10-31 DIAGNOSIS — R0981 Nasal congestion: Secondary | ICD-10-CM | POA: Diagnosis not present

## 2022-10-31 DIAGNOSIS — R058 Other specified cough: Secondary | ICD-10-CM | POA: Diagnosis not present

## 2022-10-31 DIAGNOSIS — R5383 Other fatigue: Secondary | ICD-10-CM | POA: Diagnosis not present

## 2022-11-22 DIAGNOSIS — H43813 Vitreous degeneration, bilateral: Secondary | ICD-10-CM | POA: Diagnosis not present

## 2022-11-22 DIAGNOSIS — Z961 Presence of intraocular lens: Secondary | ICD-10-CM | POA: Diagnosis not present

## 2023-01-12 DIAGNOSIS — L218 Other seborrheic dermatitis: Secondary | ICD-10-CM | POA: Diagnosis not present

## 2023-01-12 DIAGNOSIS — L720 Epidermal cyst: Secondary | ICD-10-CM | POA: Diagnosis not present

## 2023-02-24 DIAGNOSIS — M7711 Lateral epicondylitis, right elbow: Secondary | ICD-10-CM | POA: Diagnosis not present

## 2023-02-24 DIAGNOSIS — M533 Sacrococcygeal disorders, not elsewhere classified: Secondary | ICD-10-CM | POA: Diagnosis not present

## 2023-02-24 DIAGNOSIS — M25572 Pain in left ankle and joints of left foot: Secondary | ICD-10-CM | POA: Diagnosis not present

## 2023-03-02 ENCOUNTER — Encounter: Payer: Medicare Other | Admitting: Family Medicine

## 2023-03-06 ENCOUNTER — Ambulatory Visit (INDEPENDENT_AMBULATORY_CARE_PROVIDER_SITE_OTHER): Payer: Medicare Other | Admitting: Family Medicine

## 2023-03-06 VITALS — BP 108/72 | Ht 64.0 in | Wt 108.0 lb

## 2023-03-06 DIAGNOSIS — M79672 Pain in left foot: Secondary | ICD-10-CM | POA: Diagnosis not present

## 2023-03-06 NOTE — Progress Notes (Signed)
PCP: Chilton Greathouse, MD  Subjective:   HPI: Patient is a 78 y.o. female here for left foot pain.  Patient was seen at [facility] about a week ago following severe left foot pain. This pain started in the middle of the night. Couldn't touch top of her left foot. Felt inflamed but unsure if it was swollen. Felt like pain extended from top of great toe up to ankle dorsally. Tried voltaren, aleve. Also had a knot in the same area middle dorsal foot. Feels about 95% better now. She had x-rays at [facility] that were negative. Initially sent here to discuss possibility of orthotics helping her.  Past Medical History:  Diagnosis Date   Asthma    Atrophic vaginitis    Cervical intraepithelial neoplasia (CIN)    Dermoid    Osteoporosis    Ovarian cyst     Current Outpatient Medications on File Prior to Visit  Medication Sig Dispense Refill   albuterol (PROAIR HFA) 108 (90 BASE) MCG/ACT inhaler Inhale 2 puffs into the lungs every 6 (six) hours as needed for wheezing or shortness of breath. 3 Inhaler 0   amLODipine (NORVASC) 5 MG tablet Take 1 tablet (5 mg total) by mouth daily. 90 tablet 3   aspirin EC 81 MG tablet Take 1 tablet (81 mg total) by mouth daily.     cetirizine (ZYRTEC) 10 MG tablet Take 5 mg by mouth daily.     mometasone (ASMANEX 120 METERED DOSES) 220 MCG/INH inhaler Inhale 2 puffs into the lungs as needed (inhale two puff into lungs as need for seasonal allergies).     zolpidem (AMBIEN) 10 MG tablet Take 10 mg by mouth at bedtime as needed (only when traveling).     No current facility-administered medications on file prior to visit.    Past Surgical History:  Procedure Laterality Date   ANKLE SURGERY     APPENDECTOMY     CARDIAC CATHETERIZATION N/A 11/11/2014   Procedure: Left Heart Cath and Coronary Angiography;  Surgeon: Corky Crafts, MD;  Location: [facility];  Service: Cardiovascular;  Laterality: N/A;   COLPOSCOPY     CONE BIOPSY OF  CERVIX     EXCISION OF BENIGN LABIAL LESION  2008   RHINOPLASTY     TUBAL LIGATION      Allergies  Allergen Reactions   Lipitor [Atorvastatin] Other (See Comments)    Body Aches   Quinine Derivatives     rash    BP 108/72   Ht 5\' 4"  (1.626 m)   Wt 108 lb (49 kg)   BMI 18.54 kg/m      10/15/2020   10:58 AM  [facility] Adult Exercise  Frequency of aerobic exercise (# of days/week) 6  Average time in minutes 50  Frequency of strengthening activities (# of days/week) 0        No data to display              Objective:  Physical Exam:  Gen: NAD, comfortable in exam room  Left foot/ankle: Mod long arch collapse.  Transverse arch collapse with bunion, callus formation under all metatarsal heads.  Rotation of 5th digit.  No hallux rigidus. Full range of motion ankle, digits. No tenderness to palpation currently Negative ant drawer and negative talar tilt.   NV intact distally.   Limited MSK u/s left foot:  Extensor hallucis longus normal.  No effusion or abnormalities of TMT joint.    Assessment & Plan:  1. Left foot pain - length of time < 1 week with pain not consistent with gouty arthritis.  She describes an acute strain of the extensor hallucis longus but this has largely resolved.  Icing, voltaren gel if needed.  Follow up as needed.

## 2023-03-06 NOTE — Patient Instructions (Signed)
I'm glad you're doing better! If this happens again please call us to be seen. Use the voltaren gel as needed up to 4 times a day. Icing 15 minutes at a time as needed. This sounds like an acute strain of the extensor tendon of your big toe but the ultrasound looks good now. Follow up with me as needed.

## 2023-03-16 DIAGNOSIS — M533 Sacrococcygeal disorders, not elsewhere classified: Secondary | ICD-10-CM | POA: Diagnosis not present

## 2023-03-16 DIAGNOSIS — M48061 Spinal stenosis, lumbar region without neurogenic claudication: Secondary | ICD-10-CM | POA: Diagnosis not present

## 2023-03-16 DIAGNOSIS — M545 Low back pain, unspecified: Secondary | ICD-10-CM | POA: Diagnosis not present

## 2023-03-22 DIAGNOSIS — Z1152 Encounter for screening for COVID-19: Secondary | ICD-10-CM | POA: Diagnosis not present

## 2023-03-22 DIAGNOSIS — R058 Other specified cough: Secondary | ICD-10-CM | POA: Diagnosis not present

## 2023-03-22 DIAGNOSIS — R0981 Nasal congestion: Secondary | ICD-10-CM | POA: Diagnosis not present

## 2023-03-22 DIAGNOSIS — J209 Acute bronchitis, unspecified: Secondary | ICD-10-CM | POA: Diagnosis not present

## 2023-03-22 DIAGNOSIS — R5383 Other fatigue: Secondary | ICD-10-CM | POA: Diagnosis not present

## 2023-04-11 DIAGNOSIS — Z860101 Personal history of adenomatous and serrated colon polyps: Secondary | ICD-10-CM | POA: Diagnosis not present

## 2023-04-26 DIAGNOSIS — E785 Hyperlipidemia, unspecified: Secondary | ICD-10-CM | POA: Diagnosis not present

## 2023-04-26 DIAGNOSIS — Z1212 Encounter for screening for malignant neoplasm of rectum: Secondary | ICD-10-CM | POA: Diagnosis not present

## 2023-04-26 DIAGNOSIS — M810 Age-related osteoporosis without current pathological fracture: Secondary | ICD-10-CM | POA: Diagnosis not present

## 2023-04-26 DIAGNOSIS — E0590 Thyrotoxicosis, unspecified without thyrotoxic crisis or storm: Secondary | ICD-10-CM | POA: Diagnosis not present

## 2023-04-26 DIAGNOSIS — I10 Essential (primary) hypertension: Secondary | ICD-10-CM | POA: Diagnosis not present

## 2023-04-26 DIAGNOSIS — E559 Vitamin D deficiency, unspecified: Secondary | ICD-10-CM | POA: Diagnosis not present

## 2023-04-26 DIAGNOSIS — E039 Hypothyroidism, unspecified: Secondary | ICD-10-CM | POA: Diagnosis not present

## 2023-05-03 DIAGNOSIS — Z1331 Encounter for screening for depression: Secondary | ICD-10-CM | POA: Diagnosis not present

## 2023-05-03 DIAGNOSIS — E559 Vitamin D deficiency, unspecified: Secondary | ICD-10-CM | POA: Diagnosis not present

## 2023-05-03 DIAGNOSIS — M5416 Radiculopathy, lumbar region: Secondary | ICD-10-CM | POA: Diagnosis not present

## 2023-05-03 DIAGNOSIS — M1990 Unspecified osteoarthritis, unspecified site: Secondary | ICD-10-CM | POA: Diagnosis not present

## 2023-05-03 DIAGNOSIS — I444 Left anterior fascicular block: Secondary | ICD-10-CM | POA: Diagnosis not present

## 2023-05-03 DIAGNOSIS — I10 Essential (primary) hypertension: Secondary | ICD-10-CM | POA: Diagnosis not present

## 2023-05-03 DIAGNOSIS — E0590 Thyrotoxicosis, unspecified without thyrotoxic crisis or storm: Secondary | ICD-10-CM | POA: Diagnosis not present

## 2023-05-03 DIAGNOSIS — R252 Cramp and spasm: Secondary | ICD-10-CM | POA: Diagnosis not present

## 2023-05-03 DIAGNOSIS — K5730 Diverticulosis of large intestine without perforation or abscess without bleeding: Secondary | ICD-10-CM | POA: Diagnosis not present

## 2023-05-03 DIAGNOSIS — Z1339 Encounter for screening examination for other mental health and behavioral disorders: Secondary | ICD-10-CM | POA: Diagnosis not present

## 2023-05-03 DIAGNOSIS — R82998 Other abnormal findings in urine: Secondary | ICD-10-CM | POA: Diagnosis not present

## 2023-05-03 DIAGNOSIS — I2510 Atherosclerotic heart disease of native coronary artery without angina pectoris: Secondary | ICD-10-CM | POA: Diagnosis not present

## 2023-05-03 DIAGNOSIS — Z0000 Encounter for general adult medical examination without abnormal findings: Secondary | ICD-10-CM | POA: Diagnosis not present

## 2023-05-03 DIAGNOSIS — J449 Chronic obstructive pulmonary disease, unspecified: Secondary | ICD-10-CM | POA: Diagnosis not present

## 2023-05-03 DIAGNOSIS — E785 Hyperlipidemia, unspecified: Secondary | ICD-10-CM | POA: Diagnosis not present

## 2023-05-03 DIAGNOSIS — M810 Age-related osteoporosis without current pathological fracture: Secondary | ICD-10-CM | POA: Diagnosis not present

## 2023-05-17 DIAGNOSIS — Z860100 Personal history of colon polyps, unspecified: Secondary | ICD-10-CM | POA: Diagnosis not present

## 2023-05-17 DIAGNOSIS — D120 Benign neoplasm of cecum: Secondary | ICD-10-CM | POA: Diagnosis not present

## 2023-05-17 DIAGNOSIS — Z09 Encounter for follow-up examination after completed treatment for conditions other than malignant neoplasm: Secondary | ICD-10-CM | POA: Diagnosis not present

## 2023-05-17 DIAGNOSIS — K635 Polyp of colon: Secondary | ICD-10-CM | POA: Diagnosis not present

## 2023-05-17 DIAGNOSIS — K5730 Diverticulosis of large intestine without perforation or abscess without bleeding: Secondary | ICD-10-CM | POA: Diagnosis not present

## 2023-05-19 DIAGNOSIS — D120 Benign neoplasm of cecum: Secondary | ICD-10-CM | POA: Diagnosis not present

## 2023-05-19 DIAGNOSIS — K635 Polyp of colon: Secondary | ICD-10-CM | POA: Diagnosis not present

## 2023-07-03 DIAGNOSIS — M533 Sacrococcygeal disorders, not elsewhere classified: Secondary | ICD-10-CM | POA: Diagnosis not present

## 2023-08-15 ENCOUNTER — Other Ambulatory Visit: Payer: Self-pay | Admitting: Internal Medicine

## 2023-08-15 DIAGNOSIS — Z1231 Encounter for screening mammogram for malignant neoplasm of breast: Secondary | ICD-10-CM

## 2023-08-30 ENCOUNTER — Ambulatory Visit
Admission: RE | Admit: 2023-08-30 | Discharge: 2023-08-30 | Disposition: A | Discharge status: Home / Self Care | Source: Ambulatory Visit | Attending: Internal Medicine | Admitting: Internal Medicine

## 2023-08-30 DIAGNOSIS — Z1231 Encounter for screening mammogram for malignant neoplasm of breast: Secondary | ICD-10-CM

## 2023-10-02 DIAGNOSIS — M47816 Spondylosis without myelopathy or radiculopathy, lumbar region: Secondary | ICD-10-CM | POA: Diagnosis not present

## 2023-10-03 DIAGNOSIS — H524 Presbyopia: Secondary | ICD-10-CM | POA: Diagnosis not present

## 2023-10-03 DIAGNOSIS — H43813 Vitreous degeneration, bilateral: Secondary | ICD-10-CM | POA: Diagnosis not present

## 2023-10-11 DIAGNOSIS — Z23 Encounter for immunization: Secondary | ICD-10-CM | POA: Diagnosis not present

## 2023-10-12 DIAGNOSIS — M47816 Spondylosis without myelopathy or radiculopathy, lumbar region: Secondary | ICD-10-CM | POA: Diagnosis not present

## 2023-10-16 DIAGNOSIS — M47816 Spondylosis without myelopathy or radiculopathy, lumbar region: Secondary | ICD-10-CM | POA: Diagnosis not present

## 2023-10-20 DIAGNOSIS — M533 Sacrococcygeal disorders, not elsewhere classified: Secondary | ICD-10-CM | POA: Diagnosis not present

## 2023-10-23 DIAGNOSIS — L853 Xerosis cutis: Secondary | ICD-10-CM | POA: Diagnosis not present

## 2023-10-23 DIAGNOSIS — L821 Other seborrheic keratosis: Secondary | ICD-10-CM | POA: Diagnosis not present

## 2023-10-23 DIAGNOSIS — L218 Other seborrheic dermatitis: Secondary | ICD-10-CM | POA: Diagnosis not present

## 2023-10-26 DIAGNOSIS — M1611 Unilateral primary osteoarthritis, right hip: Secondary | ICD-10-CM | POA: Diagnosis not present
